# Patient Record
Sex: Male | Born: 1960
Health system: Southern US, Community
[De-identification: ages and names within clinical notes are randomized; demographics above are authoritative.]

## PROBLEM LIST (undated history)

## (undated) DIAGNOSIS — Z95810 Presence of automatic (implantable) cardiac defibrillator: Secondary | ICD-10-CM

## (undated) DIAGNOSIS — E785 Hyperlipidemia, unspecified: Secondary | ICD-10-CM

## (undated) DIAGNOSIS — F32A Depression, unspecified: Secondary | ICD-10-CM

## (undated) DIAGNOSIS — F329 Major depressive disorder, single episode, unspecified: Secondary | ICD-10-CM

## (undated) DIAGNOSIS — E669 Obesity, unspecified: Secondary | ICD-10-CM

## (undated) DIAGNOSIS — F419 Anxiety disorder, unspecified: Secondary | ICD-10-CM

## (undated) DIAGNOSIS — J302 Other seasonal allergic rhinitis: Secondary | ICD-10-CM

## (undated) DIAGNOSIS — I472 Ventricular tachycardia, unspecified: Secondary | ICD-10-CM

## (undated) DIAGNOSIS — C4491 Basal cell carcinoma of skin, unspecified: Secondary | ICD-10-CM

## (undated) DIAGNOSIS — I10 Essential (primary) hypertension: Secondary | ICD-10-CM

## (undated) DIAGNOSIS — M1990 Unspecified osteoarthritis, unspecified site: Secondary | ICD-10-CM

## (undated) DIAGNOSIS — H547 Unspecified visual loss: Secondary | ICD-10-CM

## (undated) DIAGNOSIS — I422 Other hypertrophic cardiomyopathy: Secondary | ICD-10-CM

## (undated) HISTORY — DX: Hyperlipidemia, unspecified: E78.5

## (undated) HISTORY — DX: Essential (primary) hypertension: I10

## (undated) HISTORY — DX: Other seasonal allergic rhinitis: J30.2

## (undated) HISTORY — DX: Presence of automatic (implantable) cardiac defibrillator: Z95.810

## (undated) HISTORY — DX: Ventricular tachycardia: I47.2

## (undated) HISTORY — PX: MOHS SURGERY: SHX181

## (undated) HISTORY — DX: Other hypertrophic cardiomyopathy: I42.2

## (undated) HISTORY — DX: Basal cell carcinoma of skin, unspecified: C44.91

## (undated) HISTORY — DX: Depression, unspecified: F32.A

## (undated) HISTORY — DX: Obesity, unspecified: E66.9

## (undated) HISTORY — DX: Unspecified visual loss: H54.7

## (undated) HISTORY — DX: Anxiety disorder, unspecified: F41.9

## (undated) HISTORY — DX: Major depressive disorder, single episode, unspecified: F32.9

## (undated) HISTORY — DX: Unspecified osteoarthritis, unspecified site: M19.90

## (undated) HISTORY — DX: Ventricular tachycardia, unspecified: I47.20

---

## 2005-03-16 DIAGNOSIS — C4491 Basal cell carcinoma of skin, unspecified: Secondary | ICD-10-CM

## 2005-03-16 HISTORY — DX: Basal cell carcinoma of skin, unspecified: C44.91

## 2010-10-20 ENCOUNTER — Ambulatory Visit (INDEPENDENT_AMBULATORY_CARE_PROVIDER_SITE_OTHER): Payer: 59 | Admitting: Medical

## 2010-10-20 ENCOUNTER — Encounter: Payer: Self-pay | Admitting: Medical

## 2010-10-20 VITALS — BP 100/72 | HR 64 | Temp 98.2°F | Ht 73.0 in | Wt 225.0 lb

## 2010-10-20 DIAGNOSIS — J40 Bronchitis, not specified as acute or chronic: Secondary | ICD-10-CM

## 2010-10-20 MED ORDER — ERYTHROMYCIN BASE 500 MG PO TABS: 500.0000 mg | ORAL_TABLET | Freq: Two times a day (BID) | ORAL | Status: AC

## 2010-10-20 MED ORDER — HYDROCODONE-HOMATROPINE 5-1.5 MG/5ML PO SYRP: 5.0000 mL | ORAL_SOLUTION | Freq: Four times a day (QID) | ORAL | Status: AC | PRN

## 2010-10-20 NOTE — Progress Notes (Signed)
Subjective:     Nathan [name] is a 50 y.o. male who presents for evaluation of productive cough.  He is a nonsmoker and has been in usual state of healthy up until 1 wk ago.  Symptoms include achiness, congestion, lightheadedness, low grade fever and productive cough with  green colored sputum. Onset of symptoms was 1 week ago, and has been gradually worsening since that time. Treatment to date: Nyquil and Dayquil.  Wife been sick with same on medication last week, was seen here per pt.  No other aggravating or relieving factors.  No other c/o.  The following portions of the patient's history were reviewed and updated as appropriate: allergies, current medications, past family history, past medical history, past social history, past surgical history and problem list.  Review of Systems Constitutional: +low grade fever, chills; denies sweats, anorexia Skin: denies rash HEENT:  denies ear pain, ST, itchy watery eyes Cardiovascular: denies chest pain, palpitations Lungs:  +productive sputum; denies wheezing, hemoptysis, orthopnea, PND Abdomen: +few episodes of diarrhea; denies abdominal pain, nausea, vomiting GU: denies dysuria Extremities: denies edema, myalgias, arthralgias  Objective:   Filed Vitals:   10/20/10 1215  BP: 100/72  Pulse: 64  Temp: 98.2 F (36.8 C)    General appearance: Alert, WD/WN, no distress, mildly ill appearing                             Skin: warm, no rash, no diaphoresis                           Head: no sinus tenderness                            Eyes: conjunctiva normal, corneas clear, PERRLA                            Ears: pearly TMs, external ear canals normal                          Nose: septum midline, turbinates swollen, with erythema and clear discharge             Mouth/throat: MMM, tongue normal, mild pharyngeal erythema                           Neck: supple, no adenopathy, no thyromegaly, nontender                          Heart: RRR, normal  S1, S2, no murmurs                         Lungs: +bronchial breath sounds, +few lower field scattered rhonchi, no wheezes, no rales                Extremities: no edema, nontender     Assessment:   Encounter Diagnosis  Name Primary?  . Bronchitis Yes     Plan:   Prescription given today for Erythromycin and Hycodan as below.  Discussed diagnosis and treatment of bronchitis.  Suggested symptomatic OTC remedies for cough and congestion.  Nasal saline spray for nasal congestion.  Tylenol or Ibuprofen OTC for fever and malaise.  Call/return in 2-3 days if  symptoms are worse or not improving.  Advised that cough may linger even after the infection is improved.

## 2012-02-14 DIAGNOSIS — H547 Unspecified visual loss: Secondary | ICD-10-CM

## 2012-02-14 HISTORY — DX: Unspecified visual loss: H54.7

## 2012-02-22 ENCOUNTER — Other Ambulatory Visit: Payer: Self-pay | Admitting: Cardiology

## 2012-02-22 ENCOUNTER — Ambulatory Visit (INDEPENDENT_AMBULATORY_CARE_PROVIDER_SITE_OTHER): Payer: 59 | Admitting: Medical

## 2012-02-22 ENCOUNTER — Encounter: Payer: Self-pay | Admitting: Medical

## 2012-02-22 ENCOUNTER — Ambulatory Visit
Admission: RE | Admit: 2012-02-22 | Discharge: 2012-02-22 | Disposition: A | Discharge status: Home / Self Care | Payer: 59 | Source: Ambulatory Visit | Attending: Cardiology | Admitting: Cardiology

## 2012-02-22 VITALS — BP 110/80 | HR 60 | Temp 98.1°F | Resp 16 | Ht 72.0 in | Wt 236.0 lb

## 2012-02-22 DIAGNOSIS — R0600 Dyspnea, unspecified: Secondary | ICD-10-CM | POA: Insufficient documentation

## 2012-02-22 DIAGNOSIS — Z1211 Encounter for screening for malignant neoplasm of colon: Secondary | ICD-10-CM

## 2012-02-22 DIAGNOSIS — R3129 Other microscopic hematuria: Secondary | ICD-10-CM

## 2012-02-22 DIAGNOSIS — R0602 Shortness of breath: Secondary | ICD-10-CM

## 2012-02-22 DIAGNOSIS — R0609 Other forms of dyspnea: Secondary | ICD-10-CM

## 2012-02-22 DIAGNOSIS — R9431 Abnormal electrocardiogram [ECG] [EKG]: Secondary | ICD-10-CM

## 2012-02-22 DIAGNOSIS — D492 Neoplasm of unspecified behavior of bone, soft tissue, and skin: Secondary | ICD-10-CM

## 2012-02-22 DIAGNOSIS — E669 Obesity, unspecified: Secondary | ICD-10-CM

## 2012-02-22 DIAGNOSIS — Z0000 Encounter for general adult medical examination without abnormal findings: Secondary | ICD-10-CM

## 2012-02-22 DIAGNOSIS — Z23 Encounter for immunization: Secondary | ICD-10-CM

## 2012-02-22 DIAGNOSIS — R079 Chest pain, unspecified: Secondary | ICD-10-CM | POA: Insufficient documentation

## 2012-02-22 LAB — CBC WITH DIFFERENTIAL/PLATELET
Basophils Absolute: 0.1 10*3/uL (ref 0.0–0.1)
HCT: 45 % (ref 39.0–52.0)
Lymphocytes Relative: 24 % (ref 12–46)
Lymphs Abs: 1.6 10*3/uL (ref 0.7–4.0)
Monocytes Absolute: 0.5 10*3/uL (ref 0.1–1.0)
Neutro Abs: 4.3 10*3/uL (ref 1.7–7.7)
Platelets: 198 10*3/uL (ref 150–400)
RBC: 5.12 MIL/uL (ref 4.22–5.81)
RDW: 13.7 % (ref 11.5–15.5)
WBC: 6.8 10*3/uL (ref 4.0–10.5)

## 2012-02-22 LAB — LIPID PANEL
HDL: 32 mg/dL — ABNORMAL LOW (ref >39)
Triglycerides: 187 mg/dL — ABNORMAL HIGH (ref <150)

## 2012-02-22 LAB — COMPREHENSIVE METABOLIC PANEL
ALT: 30 U/L (ref 0–53)
BUN: 16 mg/dL (ref 6–23)
CO2: 27 mEq/L (ref 19–32)
Calcium: 9.2 mg/dL (ref 8.4–10.5)
Chloride: 107 mEq/L (ref 96–112)
Creat: 0.99 mg/dL (ref 0.50–1.35)
Glucose, Bld: 102 mg/dL — ABNORMAL HIGH (ref 70–99)

## 2012-02-22 LAB — POCT URINALYSIS DIPSTICK
Bilirubin, UA: NEGATIVE
Ketones, UA: NEGATIVE
Leukocytes, UA: NEGATIVE
Protein, UA: NEGATIVE
Spec Grav, UA: 1.01
pH, UA: 5
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
## 2012-02-22 NOTE — H&P (Signed)
  Balfour, Samay    Date of visit:  02/22/2012 DOB:  02/23/1961    Age:  51 yrs. Medical record number:  [id number]     Account number:  [id number] Primary Care Provider: TYSINGER, DAVID SHANE ____________________________ CURRENT DIAGNOSES  1. Chest Pain  2. Abnormal EKG  3. Dyspnea  4. Obesity(BMI30-40) ____________________________ ALLERGIES  NKDA ____________________________ MEDICATIONS  1. aspirin 81 mg tablet,chewable, 1 p.o. daily  2. Crestor 10 mg tablet, 1 p.o. daily  3. nitroglycerin 0.4 mg tablet, sublingual, PRN ____________________________ CHIEF COMPLAINTS  Dyspnea with exertion  Heaviness in chest ____________________________ HISTORY OF PRESENT ILLNESS  This very nice man is seen for cardiac consultation. He has previously been in good health without significant medical issues but has not had an evaluation in some time. For a number of months he has noted some progressive dyspnea with exertion as well as some vague upper left chest pains. He states the discomfort comes and goes and he is vague in describing it and states that he will occasionally have left arm discomfort with that. Over the past couple of weeks his dyspnea became worse and he had a particularly severe episode of dyspnea around 8 days ago while walking up a hill hunting with his son. He described dyspnea severe and acute and this prompted him to get a physical examination. He since that time has continued to have vague intermittent chest discomfort but states that his not any different than what he has been having for months. He was seen for his physical he was found to have significant anterolateral T wave inversions. He has never had a previous EKG. He denies PND or orthopnea. He does not have any significant claudication. He does not have significant erectile dysfunction. He does not have any significant edema. ____________________________ PAST HISTORY  Past Medical Illnesses:  denies hypertension or diabetes;   Cardiovascular Illnesses:  no previous history of cardiac disease.;  Surgical Procedures:  no prior surgical procedures;  Cardiology Procedures-Invasive:  no history of prior cardiac procedures;  Cardiology Procedures-Noninvasive:  no previous non-invasive procedures;  LVEF not documented ____________________________ CARDIO-PULMONARY TEST DATES EKG Date:  02/22/2012;   ____________________________ FAMILY HISTORY Father - age 85,  diabetes-unknown type,  history of CABG and  history of PTCA/stent; Mother - age 82,  alive and well; Brother 1 - age 48,  alive and well,  hypertension and hyperlipidemia;  ____________________________ SOCIAL HISTORY Alcohol Use:  occasionally;  Smoking:  never smoked;  Diet:  regular diet;  Lifestyle:  married;  Exercise:  no regular exercise;  Occupation:  real estate appraiser;  Residence:  lives with wife and son;   ____________________________ REVIEW OF SYSTEMS General:  weight gain of approximately 10 lbs Integumentary:  history of basal cell carcinoma Eyes:  denies diplopia, history of glaucoma or visual problems.Ears, Nose, Throat, Mouth:  denies any hearing loss, epistaxis, hoarseness or difficulty speaking.  Respiratory:  denies dyspnea, cough, wheezing or hemoptysis.Cardiovascular:  please review HPI  Abdominal:  denies dyspepsia, GI bleeding, constipation, or diarrhea  Genitourinary-Male:  no dysuria, urgency, frequency, or nocturia  Musculoskeletal:  arthritis of the knees  Neurological:  denies headaches, stroke, or TIA  Psychiatric:  insomnia, situational stress  Hematological/Immunologic:  denies any food allergies, bleeding disorders. ____________________________ PHYSICAL EXAMINATION VITAL SIGNS  Blood Pressure:  140/60 Sitting, Right arm, regular cuff  , 132/68 Standing, Right arm and regular cuff   Pulse:  80/min. Weight:  236.00 lbs. Height:  73"BMI: 31  Constitutional:  pleasant white   male in no acute distress, mildly obese Skin:  warm and dry to  touch, no apparent skin lesions, or masses noted. Head:  normocephalic, normal hair pattern, no masses or tenderness Eyes:  EOMS Intact, PERRLA, C and S clear, Funduscopic exam not done. ENT:  ears, nose and throat reveal no gross abnormalities.  Dentition good. Neck:  supple, without massess. No JVD, thyromegaly or carotid bruits. Carotid upstroke normal. Chest:  normal symmetry, clear to auscultation and percussion. Cardiac:  regular rhythm, normal S1 and S2, No S3 or S4, no murmurs, gallops or rubs detected. Abdomen:  abdomen soft,non-tender, no masses, no hepatospenomegaly, or aneurysm noted Peripheral Pulses:  the femoral,dorsalis pedis, and posterior tibial pulses are full and equal bilaterally with no bruits auscultated. Extremities & Back:  no deformities, clubbing, cyanosis, erythema or edema observed. Normal muscle strength and tone. Neurological:  no gross motor or sensory deficits noted, affect appropriate, oriented x3. ____________________________ IMPRESSIONS/PLAN  1. Progressive dyspnea on exertion and atypical chest pain with a markedly abnormal EKG consistent with significant ischemia. 2. Markedly abnormal EKG 3. Atypical chest pain 4. Obesity  Recommendations:  He has a markedly abnormal EKG consistent with high risk ischemia. I don't think that an exercise test will be helpful in evaluation of his symptoms particularly in view of his poor exercise tolerance now. He does have a family history of cardiac disease and had laboratory work done earlier today at his primary physician's office.  I think that he needs to have evaluation for coronary artery disease and suggested proceeding directly to cardiac catheterization. He was started on aspirin 81 mg and given a prescription for nitroglycerin. He was also started on Crestor 10 mg daily.  The cardiac catheterization procedure was explained to the patient including risks of myocardial infarction, death, stroke, bleeding,  arrhythmia, dye allergy, renal insufficiency. He understands and is willing to proceed. Possibility of intervention was also discussed with the patient. ____________________________ TODAYS ORDERS  1. 2D, color flow, doppler: First Available  2. Draw PT/INR: Today  3. PTT: Today  4. CHEST XRAY: Today  5. 12 Lead EKG: Today                       ____________________________ Cardiology Physician:  W. Spencer Eilidh Marcano, Jr. MD [facility]    

## 2012-02-25 ENCOUNTER — Encounter (HOSPITAL_BASED_OUTPATIENT_CLINIC_OR_DEPARTMENT_OTHER)
Admission: RE | Disposition: A | Discharge status: Home / Self Care | Payer: Self-pay | Source: Ambulatory Visit | Attending: Cardiology

## 2012-02-25 ENCOUNTER — Inpatient Hospital Stay (HOSPITAL_BASED_OUTPATIENT_CLINIC_OR_DEPARTMENT_OTHER)
Admission: RE | Admit: 2012-02-25 | Discharge: 2012-02-25 | Disposition: A | Discharge status: Home / Self Care | Payer: 59 | Source: Ambulatory Visit | Attending: Cardiology | Admitting: Cardiology

## 2012-02-25 ENCOUNTER — Encounter (HOSPITAL_BASED_OUTPATIENT_CLINIC_OR_DEPARTMENT_OTHER): Payer: Self-pay | Admitting: Cardiology

## 2012-02-25 ENCOUNTER — Other Ambulatory Visit: Payer: Self-pay | Admitting: Cardiovascular Disease

## 2012-02-25 DIAGNOSIS — R9431 Abnormal electrocardiogram [ECG] [EKG]: Secondary | ICD-10-CM | POA: Insufficient documentation

## 2012-02-25 DIAGNOSIS — Z6830 Body mass index (BMI) 30.0-30.9, adult: Secondary | ICD-10-CM | POA: Insufficient documentation

## 2012-02-25 DIAGNOSIS — R0989 Other specified symptoms and signs involving the circulatory and respiratory systems: Secondary | ICD-10-CM | POA: Insufficient documentation

## 2012-02-25 DIAGNOSIS — E669 Obesity, unspecified: Secondary | ICD-10-CM | POA: Insufficient documentation

## 2012-02-25 DIAGNOSIS — R0609 Other forms of dyspnea: Secondary | ICD-10-CM | POA: Insufficient documentation

## 2012-02-25 DIAGNOSIS — R079 Chest pain, unspecified: Secondary | ICD-10-CM | POA: Insufficient documentation

## 2012-02-25 DIAGNOSIS — I422 Other hypertrophic cardiomyopathy: Secondary | ICD-10-CM | POA: Insufficient documentation

## 2012-02-25 SURGERY — JV LEFT HEART CATHETERIZATION WITH CORONARY ANGIOGRAM
Anesthesia: Moderate Sedation

## 2012-02-25 MED ORDER — ACETAMINOPHEN 325 MG PO TABS: 650.0000 mg | ORAL_TABLET | ORAL | Status: DC | PRN

## 2012-02-25 MED ORDER — SODIUM CHLORIDE 0.9 % IV SOLN: INTRAVENOUS | Status: DC

## 2012-02-25 MED ORDER — DIAZEPAM 5 MG PO TABS
10.0000 mg | ORAL_TABLET | ORAL | Status: AC
  Administered 2012-02-25: 10 mg via ORAL

## 2012-02-25 MED ORDER — SODIUM CHLORIDE 0.9 % IV SOLN: 1.0000 mL/kg/h | INTRAVENOUS | Status: DC

## 2012-02-25 NOTE — OR Nursing (Signed)
Discharge instructions reviewed and signed, pt stated understanding, ambulated in hall without difficulty, site level 0, transported to wife's car via wheelchair 

## 2012-02-25 NOTE — H&P (View-Only) (Signed)
Nathan [name]    Date of visit:  02/22/2012 DOB:  1960/09/12    Age:  51 yrs. Medical record number:  [id number]     Account number:  [id number] Primary Care Provider: Ernst Breach ____________________________ CURRENT DIAGNOSES  1. Chest Pain  2. Abnormal EKG  3. Dyspnea  4. Obesity(BMI30-40) ____________________________ ALLERGIES  NKDA ____________________________ MEDICATIONS  1. aspirin 81 mg tablet,chewable, 1 p.o. daily  2. Crestor 10 mg tablet, 1 p.o. daily  3. nitroglycerin 0.4 mg tablet, sublingual, PRN ____________________________ CHIEF COMPLAINTS  Dyspnea with exertion  Heaviness in chest ____________________________ HISTORY OF PRESENT ILLNESS  This very nice man is seen for cardiac consultation. He has previously been in good health without significant medical issues but has not had an evaluation in some time. For a number of months he has noted some progressive dyspnea with exertion as well as some vague upper left chest pains. He states the discomfort comes and goes and he is vague in describing it and states that he will occasionally have left arm discomfort with that. Over the past couple of weeks his dyspnea became worse and he had a particularly severe episode of dyspnea around 8 days ago while walking up a hill hunting with his son. He described dyspnea severe and acute and this prompted him to get a physical examination. He since that time has continued to have vague intermittent chest discomfort but states that his not any different than what he has been having for months. He was seen for his physical he was found to have significant anterolateral T wave inversions. He has never had a previous EKG. He denies PND or orthopnea. He does not have any significant claudication. He does not have significant erectile dysfunction. He does not have any significant edema. ____________________________ PAST HISTORY  Past Medical Illnesses:  denies hypertension or diabetes;   Cardiovascular Illnesses:  no previous history of cardiac disease.;  Surgical Procedures:  no prior surgical procedures;  Cardiology Procedures-Invasive:  no history of prior cardiac procedures;  Cardiology Procedures-Noninvasive:  no previous non-invasive procedures;  LVEF not documented ____________________________ CARDIO-PULMONARY TEST DATES EKG Date:  02/22/2012;   ____________________________ FAMILY HISTORY Father - age 85,  diabetes-unknown type,  history of CABG and  history of PTCA/stent; Mother - age 3,  alive and well; Brother 1 - age 14,  alive and well,  hypertension and hyperlipidemia;  ____________________________ SOCIAL HISTORY Alcohol Use:  occasionally;  Smoking:  never smoked;  Diet:  regular diet;  Lifestyle:  married;  Exercise:  no regular exercise;  Occupation:  Technical sales engineer;  Residence:  lives with wife and son;   ____________________________ REVIEW OF SYSTEMS General:  weight gain of approximately 10 lbs Integumentary:  history of basal cell carcinoma Eyes:  denies diplopia, history of glaucoma or visual problems.Ears, Nose, Throat, Mouth:  denies any hearing loss, epistaxis, hoarseness or difficulty speaking.  Respiratory:  denies dyspnea, cough, wheezing or hemoptysis.Cardiovascular:  please review HPI  Abdominal:  denies dyspepsia, GI bleeding, constipation, or diarrhea  Genitourinary-Male:  no dysuria, urgency, frequency, or nocturia  Musculoskeletal:  arthritis of the knees  Neurological:  denies headaches, stroke, or TIA  Psychiatric:  insomnia, situational stress  Hematological/Immunologic:  denies any food allergies, bleeding disorders. ____________________________ PHYSICAL EXAMINATION VITAL SIGNS  Blood Pressure:  140/60 Sitting, Right arm, regular cuff  , 132/68 Standing, Right arm and regular cuff   Pulse:  80/min. Weight:  236.00 lbs. Height:  73"BMI: 31  Constitutional:  pleasant white  male in no acute distress, mildly obese Skin:  warm and dry to  touch, no apparent skin lesions, or masses noted. Head:  normocephalic, normal hair pattern, no masses or tenderness Eyes:  EOMS Intact, PERRLA, C and S clear, Funduscopic exam not done. ENT:  ears, nose and throat reveal no gross abnormalities.  Dentition good. Neck:  supple, without massess. No JVD, thyromegaly or carotid bruits. Carotid upstroke normal. Chest:  normal symmetry, clear to auscultation and percussion. Cardiac:  regular rhythm, normal S1 and S2, No S3 or S4, no murmurs, gallops or rubs detected. Abdomen:  abdomen soft,non-tender, no masses, no hepatospenomegaly, or aneurysm noted Peripheral Pulses:  the femoral,dorsalis pedis, and posterior tibial pulses are full and equal bilaterally with no bruits auscultated. Extremities & Back:  no deformities, clubbing, cyanosis, erythema or edema observed. Normal muscle strength and tone. Neurological:  no gross motor or sensory deficits noted, affect appropriate, oriented x3. ____________________________ IMPRESSIONS/PLAN  1. Progressive dyspnea on exertion and atypical chest pain with a markedly abnormal EKG consistent with significant ischemia. 2. Markedly abnormal EKG 3. Atypical chest pain 4. Obesity  Recommendations:  He has a markedly abnormal EKG consistent with high risk ischemia. I don't think that an exercise test will be helpful in evaluation of his symptoms particularly in view of his poor exercise tolerance now. He does have a family history of cardiac disease and had laboratory work done earlier today at his primary physician's office.  I think that he needs to have evaluation for coronary artery disease and suggested proceeding directly to cardiac catheterization. He was started on aspirin 81 mg and given a prescription for nitroglycerin. He was also started on Crestor 10 mg daily.  The cardiac catheterization procedure was explained to the patient including risks of myocardial infarction, death, stroke, bleeding,  arrhythmia, dye allergy, renal insufficiency. He understands and is willing to proceed. Possibility of intervention was also discussed with the patient. ____________________________ TODAYS ORDERS  1. 2D, color flow, doppler: First Available  2. Draw PT/INR: Today  3. PTT: Today  4. CHEST XRAY: Today  5. 12 Lead EKG: Today                       ____________________________ Cardiology Physician:  Darden Palmer MD [facility]

## 2012-02-25 NOTE — Interval H&P Note (Signed)
History and Physical Interval Note:  02/25/2012 8:34 AM    Patient seen and examined.  No interval change in history and exam since last note. Stable for procedure.  Nathan [name]. MD [facility]  02/25/2012

## 2012-02-25 NOTE — Progress Notes (Signed)
Bedrest begins @ 0920.  Tegaderm dressing applied to right groin site by Venda Rodes, site level 0.

## 2012-02-25 NOTE — CV Procedure (Signed)
Cardiac Catheterization Report   Nathan [name]    51 y.o.  male  DOB: 11/25/1960  MRN: [id number]  02/25/2012   PROCEDURE:  Left heart catheterization with selective coronary angiography, left ventriculogram.  INDICATIONS:  Progressive dyspnea on exertion, severely abnormal EKG, significant cardiac hypertrophy.The risks, benefits, and details of the procedure were explained to the patient.  The patient verbalized understanding and wanted to proceed.  Informed written consent was obtained.  PROCEDURE TECHNIQUE:  After Xylocaine anesthesia a 4 F sheath was placed in the right femoral artery with a single anterior needle wall stick.   Left coronary angiography was done using a Judkins L4 guide catheter.  Right coronary angiography was done using a Judkins R4 guide catheter.  A 30 cc ventriculogram was performed in the 30 degree RAO projection.  Tolerated the procedure well.  Sheath removed in the holding area.   CONTRAST:  Total of 80 cc.  COMPLICATIONS:  None.    HEMODYNAMICS:  Aortic postcontrast 100/60, left ventricle postcontrast 100/10-20  There was no gradient between the left ventricle and aorta.    ANGIOGRAPHIC DATA:    CORONARY ARTERIES:   Arise and distribute normally.  Left dominant. No coronary calcification is noted.  Left main coronary artery: Normal.  Left anterior descending: Normal..  Circumflex coronary artery: Normal. Very large vessel which is left dominant.  Right coronary artery: Nondominant vessel, normal..  LEFT VENTRICULOGRAM:  Performed in the 30 RAO projection.  The aortic and mitral valves are normal. The left ventricle is has a significant spade-shaped appearance with hypertrophy and hyperdynamic constriction of the apex. There is also increased trabeculations consistent with hypertrophy.. Estimated ejection fraction is 65%.  IMPRESSIONS:  1. Normal coronary arteries left dominant circulation 2. Angiographic  spade-shaped left ventricle consistent with apical hypertrophic cardiomyopathy  RECOMMENDATION:  The patient has no angiographic coronary artery disease but does have evidence of significant ventricular hypertrophy and an asymmetric pattern. He will have a cardiac MRI to determine further characteristics of the apical hypertrophy and also to assess for myocardial scarring.  Darden Palmer MD [facility]

## 2012-02-25 NOTE — OR Nursing (Signed)
Dr Tilley at bedside to discuss results and treatment plan with pt and family 

## 2012-02-25 NOTE — OR Nursing (Signed)
Meal served 

## 2012-03-02 ENCOUNTER — Encounter: Payer: Self-pay | Admitting: Medical

## 2012-03-02 ENCOUNTER — Other Ambulatory Visit (HOSPITAL_COMMUNITY): Payer: Self-pay | Admitting: Cardiology

## 2012-03-02 DIAGNOSIS — R079 Chest pain, unspecified: Secondary | ICD-10-CM

## 2012-03-02 DIAGNOSIS — I422 Other hypertrophic cardiomyopathy: Secondary | ICD-10-CM

## 2012-03-03 ENCOUNTER — Ambulatory Visit (HOSPITAL_COMMUNITY)
Admission: RE | Admit: 2012-03-03 | Discharge: 2012-03-03 | Disposition: A | Discharge status: Home / Self Care | Payer: 59 | Source: Ambulatory Visit | Attending: Cardiology | Admitting: Cardiology

## 2012-03-03 DIAGNOSIS — I422 Other hypertrophic cardiomyopathy: Secondary | ICD-10-CM

## 2012-03-03 DIAGNOSIS — R079 Chest pain, unspecified: Secondary | ICD-10-CM

## 2012-03-03 DIAGNOSIS — I421 Obstructive hypertrophic cardiomyopathy: Secondary | ICD-10-CM

## 2012-03-03 MED ORDER — GADOBENATE DIMEGLUMINE 529 MG/ML IV SOLN
30.0000 mL | Freq: Once | INTRAVENOUS | Status: AC | PRN
  Administered 2012-03-03: 30 mL via INTRAVENOUS

## 2012-03-04 ENCOUNTER — Encounter (INDEPENDENT_AMBULATORY_CARE_PROVIDER_SITE_OTHER): Payer: 59

## 2012-03-04 ENCOUNTER — Other Ambulatory Visit: Payer: Self-pay

## 2012-03-04 ENCOUNTER — Encounter: Payer: Self-pay | Admitting: Cardiology

## 2012-03-04 ENCOUNTER — Other Ambulatory Visit: Payer: Self-pay | Admitting: *Deleted

## 2012-03-04 DIAGNOSIS — R079 Chest pain, unspecified: Secondary | ICD-10-CM

## 2012-03-04 DIAGNOSIS — R0602 Shortness of breath: Secondary | ICD-10-CM

## 2012-03-04 DIAGNOSIS — R9431 Abnormal electrocardiogram [ECG] [EKG]: Secondary | ICD-10-CM

## 2012-03-04 NOTE — Progress Notes (Signed)
Patient ID: Nathan [name], male   DOB: 1961/02/15, 51 y.o.   MRN: [id number]  Karsin, Pesta    Date of visit:  03/04/2012 DOB:  October 25, 1960    Age:  51 yrs. Medical record number:  [id number]     Account number:  [id number] Primary Care Provider: Ernst Breach ____________________________ CURRENT DIAGNOSES  1. Apical Hypertrophic Cardiomyopathy  2. Abnormal EKG  3. Obesity(BMI30-40) ____________________________ ALLERGIES  NKDA ____________________________ MEDICATIONS  1. Crestor 10 mg tablet, 1 p.o. daily  2. metoprolol succinate 50 mg tablet extended release 24 hr, 1 p.o. daily ____________________________ CHIEF COMPLAINTS  Followup of Apical Hypertrophic Cardiomyopathy ____________________________ HISTORY OF PRESENT ILLNESS  Patient returns for cardiac followup. Since he was previously here he had a cardiac catheterization that showed normal coronary arteries. The echocardiogram showed what looked like mid ventricular and apical hypertrophy. He has subsequently had a cardiac MRI that showed extensive mid ventricular and apical hypertrophy with extensive scarring at the apex on cardiac MRI. His ejection fraction was 60%.  He returns today for followup and further discussion. He had an episode of dizziness about 2 years ago but has never had syncope. There's no family history of syncope. He does have some dyspnea with exertion. ____________________________ PAST HISTORY  Past Medical Illnesses:  denies hypertension or diabetes;  Cardiovascular Illnesses:  no previous history of cardiac disease.;  Surgical Procedures:  no prior surgical procedures;  Cardiology Procedures-Invasive:  cardiac cath (left) December 2013;  Cardiology Procedures-Noninvasive:  echocardiogram December 2013, cardiac MRI December 2013;  Cardiac Cath Results:  normal coronary arteries;  LVEF of 65% documented via cardiac cath on 02/25/2012 ____________________________ CARDIO-PULMONARY TEST DATES EKG Date:   02/22/2012;   Cardiac Cath Date:  02/25/2012;  Echocardiography Date: 02/24/2012;  Chest Xray Date: 02/22/2012;  CT Scan Date:  03/03/2012   ____________________________ SOCIAL HISTORY Alcohol Use:  occasionally;  Smoking:  never smoked;  Diet:  regular diet;  Lifestyle:  married;  Exercise:  no regular exercise;  Occupation:  Technical sales engineer;  Residence:  lives with wife and son;   ____________________________ PHYSICAL EXAMINATION VITAL SIGNS  Blood Pressure:  124/70 Sitting, Left arm, regular cuff  , 118/62 Supine, Left arm and regular cuff   Pulse:  72/min. Weight:  234.00 lbs. Height:  73"BMI: 31  Constitutional:  pleasant white male in no acute distress, mildly obese Skin:  warm and dry to touch, no apparent skin lesions, or masses noted. Head:  normocephalic, normal hair pattern, no masses or tenderness Chest:  normal symmetry, clear to auscultation and percussion. Cardiac:  regular rhythm, normal S1 and S2, No S3 or S4, no murmurs, gallops or rubs detected. Extremities & Back:  cath site clean and dry ____________________________ IMPRESSIONS/PLAN  1. Apical variant hypertrophic cardiomyopathy with stents of myocardial scarring at the apex as well as the mid ventricular segment 2. Dyspnea that may be due to the above 3. Hyperlipidemia 4. Obesity  Recommendations:  Extensive discussion with patient. He does have extensive myocardial scarring and we discussed the apical hypertrophy, his abnormal EKG and symptoms. He has never had syncope or palpitations.  I've recommended that he initiate treatment with a beta blocker with extended release metoprolol 50 mg daily. I would like for him to have a 48-hour Holter monitor to determine what his PVC count is and whether he has any more advanced arrhythmias. I would also like him to have an electrophysiology consultation to determine whether he could be at risk for sudden death because of the amount  of  myocardial scarring and whether he  would need to be considered for a defibrillator.  We talked about activity and avoidance of weight lifting but did give the okay to hunt  and to fish. Followup in one month. We also talked about following a carbohydrate restricted diet and try to get some regular exercise such as walking. ____________________________ TODAYS ORDERS  1. Return Visit: 1 month  2. 48 hour holter monitor  3.  Consult  [facility] EP: Schedule at convenience                       ____________________________ Cardiology Physician:  Darden Palmer MD [facility]

## 2012-03-04 NOTE — Progress Notes (Signed)
Patient ID: Nathan [name], male   DOB: [birth year], 51 y.o.   MRN: [id number] Placed a 48 hr holter monitor on patient on 03/04/12 serial # [id number]. I also went over instruction on how to use the monitor and when to return the monitor

## 2012-03-08 ENCOUNTER — Institutional Professional Consult (permissible substitution): Payer: Self-pay | Admitting: Internal Medicine

## 2012-03-08 ENCOUNTER — Ambulatory Visit (INDEPENDENT_AMBULATORY_CARE_PROVIDER_SITE_OTHER): Payer: 59 | Admitting: Internal Medicine

## 2012-03-08 ENCOUNTER — Encounter: Payer: Self-pay | Admitting: Internal Medicine

## 2012-03-08 ENCOUNTER — Encounter: Payer: Self-pay | Admitting: *Deleted

## 2012-03-08 VITALS — BP 127/74 | HR 70 | Resp 18 | Ht 72.0 in | Wt 235.0 lb

## 2012-03-08 DIAGNOSIS — I422 Other hypertrophic cardiomyopathy: Secondary | ICD-10-CM

## 2012-03-08 DIAGNOSIS — I428 Other cardiomyopathies: Secondary | ICD-10-CM

## 2012-03-08 DIAGNOSIS — I2589 Other forms of chronic ischemic heart disease: Secondary | ICD-10-CM

## 2012-03-08 DIAGNOSIS — I255 Ischemic cardiomyopathy: Secondary | ICD-10-CM
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
## 2012-03-08 NOTE — Assessment & Plan Note (Signed)
The patient has a new diagnosis of apical HCM. He has been found by MR to have significant scarring. This is thought to be a modulating risk factor is a posterior risk factor in of itself. We await the results of his Holter monitor and he will need exercise testing to look to see if he has abnormal vasomotor response. His one episode of syncope occurred in the dentist chair and is likely vasovagal and does not impact our thinking here. In the event that he were to have an abnormal Holter and or exercise test at that point consideration for ICD implantation would be appropriate for primary prevention  We also discussed the importance of trying to identify the kindred with which this changes associated in his family, i.e. either his mother's family or his father's family. We also discussed the importance of screening of his children and that this is a longitudinal process. I recommended that name Rosiland Oz from [facility] who could help with this.

## 2012-03-14 ENCOUNTER — Other Ambulatory Visit (HOSPITAL_COMMUNITY): Payer: 59

## 2012-03-14 ENCOUNTER — Telehealth: Payer: Self-pay | Admitting: Internal Medicine

## 2012-03-14 NOTE — Telephone Encounter (Signed)
Patient called no answer.Left message on personal voice mail monitor results not available.Message sent to Dr.Klein's nurse.

## 2012-03-14 NOTE — Telephone Encounter (Signed)
Pt wanted to get results of monitor he turned in on last Monday

## 2012-04-05 ENCOUNTER — Encounter: Payer: Self-pay | Admitting: Cardiology

## 2012-04-05 NOTE — Progress Notes (Signed)
Patient ID: Nathan [name], male   DOB: [birth year], 52 y.o.   MRN: [id number]  Nathan [name], Nathan [name]    Date of visit:  04/05/2012 DOB:  [birth year]    Age:  51 yrs. Medical record number:  [id number]     Account number:  [id number] Primary Care Provider: Ernst Breach ____________________________ CURRENT DIAGNOSES  1. Apical Hypertrophic Cardiomyopathy  2. Abnormal EKG  3. Obesity(BMI30-40)  4. Dyspnea ____________________________ ALLERGIES  NKDA ____________________________ MEDICATIONS  1. Crestor 10 mg tablet, 1 p.o. daily  2. metoprolol succinate 50 mg tablet extended release 24 hr, 1 p.o. daily ____________________________ CHIEF COMPLAINTS  Followup of Apical Hypertrophic Cardiomyopathy ____________________________ HISTORY OF PRESENT ILLNESS  Patient seen for cardiac followup. He is feeling fairly well at this time and notes less dyspnea. He denies angina and has been able to do some more walking without definite chest pain. He will change the complaint of a vague sensation in his left upper chest and his not related to activity. He has not seen Dr. Graciela Husbands back. I have not heard the results of the 48 hour monitor. ___________________________ PAST HISTORY  Past Medical Illnesses:  denies hypertension or diabetes;  Cardiovascular Illnesses:  no previous history of cardiac disease.;  Surgical Procedures:  no prior surgical procedures;  Cardiology Procedures-Invasive:  cardiac cath (left) December 2013;  Cardiology Procedures-Noninvasive:  echocardiogram December 2013, cardiac MRI December 2013;  Cardiac Cath Results:  normal coronary arteries;  LVEF of 65% documented via cardiac cath on 02/25/2012 ____________________________ CARDIO-PULMONARY TEST DATES EKG Date:  02/22/2012;   Cardiac Cath Date:  02/25/2012;  Echocardiography Date: 02/24/2012;  Chest Xray Date: 02/22/2012;  CT Scan Date:  03/03/2012   ____________________________ SOCIAL HISTORY Alcohol Use:  occasionally;  Smoking:  never  smoked;  Diet:  regular diet;  Lifestyle:  married;  Exercise:  no regular exercise;  Occupation:  Technical sales engineer;  Residence:  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  Cardiovascular:  please review HPI Genitourinary-Male:  no dysuria, urgency, frequency, or nocturia  Musculoskeletal:  arthritis of the knees  Psychiatric:  insomnia, situational stress ____________________________ PHYSICAL EXAMINATION VITAL SIGNS  Blood Pressure:  120/70 Sitting, Left arm, regular cuff  , 114/70 Standing, Left arm and regular cuff   Pulse:  64/min. Weight:  234.00 lbs. Height:  73"BMI: 31  Constitutional:  pleasant white male in no acute distress, mildly obese Skin:  warm and dry to touch, no apparent skin lesions, or masses noted. Head:  normocephalic, normal hair pattern, no masses or tenderness Chest:  normal symmetry, clear to auscultation and percussion. Cardiac:  regular rhythm, normal S1 and S2, No S3 or S4, no murmurs, gallops or rubs detected. Extremities & Back:  no deformities, clubbing, cyanosis, erythema or edema observed. Normal muscle strength and tone. ____________________________ IMPRESSIONS/PLAN  1. Apical hypertrophic variant cardiomyopathy 2. Obesity with need to lose weight 3. Hyperlipidemia  Recommendations:  He has not gotten screening yet for his children. He is doing well on the beta blocker and had a normal blood pressure response on his treadmill. I will see him in followup in 6 months. I asked him to get an appointment with Dr. Graciela Husbands for final recommendations for risk stratification for defibrillator. ____________________________ TODAYS ORDERS  1. Return Visit: 6 months  ____________________________ Cardiology Physician:  Darden Palmer MD [facility]

## 2012-04-18 ENCOUNTER — Encounter: Payer: Self-pay | Admitting: Internal Medicine

## 2012-04-19 ENCOUNTER — Encounter: Payer: Self-pay | Admitting: Internal Medicine

## 2012-04-19 ENCOUNTER — Ambulatory Visit (INDEPENDENT_AMBULATORY_CARE_PROVIDER_SITE_OTHER): Payer: 59 | Admitting: Internal Medicine

## 2012-04-19 VITALS — BP 122/63 | HR 53 | Ht 73.0 in | Wt 236.0 lb

## 2012-04-19 DIAGNOSIS — I428 Other cardiomyopathies: Secondary | ICD-10-CM

## 2012-04-19 DIAGNOSIS — I422 Other hypertrophic cardiomyopathy: Secondary | ICD-10-CM
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
## 2012-04-19 NOTE — Patient Instructions (Signed)
We will see you back on an as needed basis.  

## 2012-04-19 NOTE — Assessment & Plan Note (Signed)
The patient has no clear risk factors for sudden deaths in the setting of his apical HCM. The issue of gadolinium enhancement is thought to be an intermediate variable.  We had a lengthy discussion regarding genetic testing and I recommended that we pursue it to see if we can identify a genetic probe with which to explore the family. I have reviewed with him some of the issues related to genetic testing. Genetic counseling is offered as part of the services from [facility].  I will also explore medication options to try to decrease the mid cavitary obliteration which may have some pathophysiological relationship to the apical thinning

## 2012-07-28 ENCOUNTER — Ambulatory Visit (INDEPENDENT_AMBULATORY_CARE_PROVIDER_SITE_OTHER): Payer: 59 | Admitting: Family Medicine

## 2012-07-28 ENCOUNTER — Encounter: Payer: Self-pay | Admitting: Family Medicine

## 2012-07-28 VITALS — BP 102/64 | HR 56 | Temp 98.2°F | Ht 72.0 in | Wt 232.0 lb

## 2012-07-28 DIAGNOSIS — R05 Cough: Secondary | ICD-10-CM

## 2012-07-28 DIAGNOSIS — R059 Cough, unspecified: Secondary | ICD-10-CM

## 2012-07-28 DIAGNOSIS — J309 Allergic rhinitis, unspecified: Secondary | ICD-10-CM

## 2012-07-28 DIAGNOSIS — R319 Hematuria, unspecified: Secondary | ICD-10-CM

## 2012-07-28 LAB — POCT URINALYSIS DIPSTICK
Bilirubin, UA: NEGATIVE
Ketones, UA: NEGATIVE
Protein, UA: NEGATIVE
Spec Grav, UA: 1.01

## 2012-07-28 LAB — POCT UA - MICROSCOPIC ONLY

## 2012-07-28 MED ORDER — AZITHROMYCIN 250 MG PO TABS: ORAL_TABLET | ORAL | Status: DC
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
## 2012-07-28 NOTE — Patient Instructions (Addendum)
  Take zyrtec daily. Start Mucinex (plain with delsym as needed for cough, or the Mucinex DM which contains the same ingredient as Delsym)  Start z-pak if cough persists, worsens, discolored mucus, fever develops  We will be in touch with results of urine, and what the next step is in evaluating the blood in the urine.  Hematuria, Adult Hematuria (blood in your urine) can be caused by a bladder infection (cystitis), kidney infection (pyelonephritis), prostate infection (prostatitis), or kidney stone. Infections will usually respond to antibiotics (medications which kill germs), and a kidney stone will usually pass through your urine without further treatment. If you were put on antibiotics, take all the medicine until gone. You may feel better in a few days, but take all of your medicine or the infection may not respond and become more difficult to treat. If antibiotics were not given, an infection did not cause the blood in the urine. A further work up to find out the reason may be needed. HOME CARE INSTRUCTIONS   Drink lots of fluid, 3 to 4 quarts a day. If you have been diagnosed with an infection, cranberry juice is especially recommended, in addition to large amounts of water.  Avoid caffeine, tea, and carbonated beverages, because they tend to irritate the bladder.  Avoid alcohol as it may irritate the prostate.  Only take over-the-counter or prescription medicines for pain, discomfort, or fever as directed by your caregiver.  If you have been diagnosed with a kidney stone follow your caregivers instructions regarding straining your urine to catch the stone. TO PREVENT FURTHER INFECTIONS:  Empty the bladder often. Avoid holding urine for long periods of time.  After a bowel movement, women should cleanse front to back. Use each tissue only once.  Empty the bladder before and after sexual intercourse if you are a male.  Return to your caregiver if you develop back pain, fever,  nausea (feeling sick to your stomach), vomiting, or your symptoms (problems) are not better in 3 days. Return sooner if you are getting worse. If you have been requested to return for further testing make sure to keep your appointments. If an infection is not the cause of blood in your urine, X-rays may be required. Your caregiver will discuss this with you. SEEK IMMEDIATE MEDICAL CARE IF:   You have a persistent fever over 102 F (38.9 C).  You develop severe vomiting and are unable to keep the medication down.  You develop severe back or abdominal pain despite taking your medications.  You begin passing a large amount of blood or clots in your urine.  You feel extremely weak or faint, or pass out. MAKE SURE YOU:   Understand these instructions.  Will watch your condition.  Will get help right away if you are not doing well or get worse. Document Released: 03/02/2005 Document Revised: 05/25/2011 Document Reviewed: 10/20/2007 [facility] Patient Information 2013 [place], [place].

## 2012-07-29 LAB — URINE CULTURE
Colony Count: NO GROWTH
Organism ID, Bacteria: NO GROWTH

## 2012-08-02 ENCOUNTER — Telehealth: Payer: Self-pay | Admitting: Family Medicine

## 2012-08-02 NOTE — Telephone Encounter (Signed)
Patient is aware of his appointment to see Dr. Sherron Monday on 08/26/12 @ 130 pm. [facility] [phone]

## 2012-10-11 ENCOUNTER — Encounter: Payer: Self-pay | Admitting: Cardiology

## 2012-10-11 NOTE — Progress Notes (Signed)
Patient ID: Nathan [name], male   DOB: 04-29-1960, 52 y.o.   MRN: [id number]   Nathan [name]    Date of visit:  10/11/2012 DOB:  23-Sep-1960    Age:  51 yrs. Medical record number:  [id number]     Account number:  [id number] Primary Care Provider: Ernst Breach ____________________________ CURRENT DIAGNOSES  1. Apical Hypertrophic Cardiomyopathy  2. Abnormal EKG  3. Obesity(BMI30-40)  4. Dyspnea ____________________________ ALLERGIES  No Known Drug Allergies ____________________________ MEDICATIONS  1. Crestor 10 mg tablet, 1 p.o. daily  2. metoprolol succinate 50 mg tablet extended release 24 hr, 1 p.o. daily ____________________________ CHIEF COMPLAINTS  Followup of Apical Hypertrophic Cardiomyopathy ____________________________ HISTORY OF PRESENT ILLNESS  Patient seen for cardiac followup of his known apical hypertrophic cardiomyopathy. He has been doing well since he was here without chest pain or significant shortness of breath. He has gained some weight. He has seen Dr. Graciela Husbands who did not recommend having a defibrillator. He states that his son has it and screening for hypertrophic cardiomyopathy with an echo and an EKG and that his daughter is in the process of trying to get this done. He had one episode of dizziness. He has no PND, orthopnea or claudication. ____________________________ PAST HISTORY  Past Medical Illnesses:  denies hypertension or diabetes;  Cardiovascular Illnesses:  no previous history of cardiac disease.;  Surgical Procedures:  no prior surgical procedures;  Cardiology Procedures-Invasive:  cardiac cath (left) December 2013;  Cardiology Procedures-Noninvasive:  echocardiogram December 2013, cardiac MRI December 2013;  Cardiac Cath Results:  normal coronary arteries;  LVEF of 65% documented via cardiac cath on 02/25/2012,   ____________________________ CARDIO-PULMONARY TEST DATES EKG Date:  02/22/2012;   Cardiac Cath Date:  02/25/2012;  Echocardiography Date:  02/24/2012;  Chest Xray Date: 02/22/2012;  CT Scan Date:  03/03/2012   ____________________________ SOCIAL HISTORY Alcohol Use:  occasionally;  Smoking:  never smoked;  Diet:  regular diet;  Lifestyle:  married;  Exercise:  no regular exercise;  Occupation:  Technical sales engineer;  Residence:  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 Cardiovascular:  please review HPI  Genitourinary-Male: no dysuria, urgency, frequency, or nocturia  Musculoskeletal:  arthritis of the knees Psychiatric:  insomnia, situational stress  ____________________________ PHYSICAL EXAMINATION VITAL SIGNS  Blood Pressure:  120/70 Sitting, Left arm, regular cuff   Pulse:  56/min. Weight:  237.00 lbs. Height:  73"BMI: 31  Constitutional:  pleasant white male in no acute distress, mildly obese Skin:  warm and dry to touch, no apparent skin lesions, or masses noted. Head:  normocephalic, normal hair pattern, no masses or tenderness Chest:  normal symmetry, clear to auscultation and percussion. Cardiac:  regular rhythm, normal S1 and S2, No S3 or S4, no murmurs, gallops or rubs detected. Extremities & Back:  no deformities, clubbing, cyanosis, erythema or edema observed. Normal muscle strength and tone. ___________________________ IMPRESSIONS/PLAN  1. Apical hypertrophic cardiomyopathy 2. Obesity does need to lose weight  Recommendations:  Clinically he is doing well at this time and recommended followup in 6 months. Discussed importance as family screening. ____________________________ TODAYS ORDERS  1. Return Visit: 6 months  2. 12 Lead EKG: 6 months                       ____________________________ Cardiology Physician:  Darden Palmer MD [facility]

## 2013-04-11 ENCOUNTER — Encounter: Payer: Self-pay | Admitting: Cardiology

## 2013-04-11 NOTE — Progress Notes (Unsigned)
Patient ID: Undrea Shipes, male   DOB: 1960/11/21, 53 y.o.   MRN: [id number]   Claudis, Giovanelli    Date of visit:  04/11/2013 DOB:  [birth year]    Age:  53 yrs. Medical record number:  [id number]     Account number:  [id number] Primary Care Provider: Robyne Askew ____________________________ CURRENT DIAGNOSES  1. Apical Hypertrophic Cardiomyopathy  2. Abnormal EKG  3. Obesity(BMI30-40)  4. Hypertrophic cardiomyopathy  5. Dyspnea ____________________________ ALLERGIES  No Known Drug Allergies ____________________________ MEDICATIONS  1. metoprolol succinate ER 50 mg tablet,extended release 24 hr, 1 p.o. daily  2. Crestor 10 mg tablet, 1 p.o. daily ____________________________ CHIEF COMPLAINTS  Followup of Apical Hypertrophic Cardiomyopathy ____________________________ HISTORY OF PRESENT ILLNESS  Patient seen for cardiac followup. He has been doing well since he was previously here. He has not really had any dizziness although he complains of fatigue. He has had no syncope and denies PND, orthopnea, claudication, or palpitations. His children were screened for cardiomyopathy in their echocardiograms are normal at the present time. ____________________________ PAST HISTORY  Past Medical Illnesses:  denies hypertension or diabetes;  Cardiovascular Illnesses:  apical hypertrophic cardiomyopathy;  Surgical Procedures:  no prior surgical procedures;  Cardiology Procedures-Invasive:  cardiac cath (left) December 2013;  Cardiology Procedures-Noninvasive:  echocardiogram December 2013, cardiac MRI December 2013;  Cardiac Cath Results:  normal coronary arteries;  LVEF of 65% documented via cardiac cath on 02/25/2012,   ____________________________ CARDIO-PULMONARY TEST DATES EKG Date:  04/11/2013;   Cardiac Cath Date:  02/25/2012;  Echocardiography Date: 02/24/2012;  Chest Xray Date: 02/22/2012;  CT Scan Date:  03/03/2012   ____________________________ FAMILY HISTORY Brother -- Hypertension,  Brother alive with problem Father -- Diabetes mellitus, Coronary Artery Disease, Father alive with problem Mother -- Mother alive and well ____________________________ SOCIAL HISTORY Alcohol Use:  occasionally;  Smoking:  never smoked;  Diet:  regular diet;  Lifestyle:  married;  Exercise:  no regular exercise;  Occupation:  Clinical cytogeneticist;  Residence:  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 Cardiovascular:  please review HPI  Genitourinary-Male: no dysuria, urgency, frequency, or nocturia  Musculoskeletal:  arthritis of the knees Psychiatric:  insomnia, situational stress  ____________________________ PHYSICAL EXAMINATION VITAL SIGNS  Blood Pressure:  124/70 Sitting, Left arm, regular cuff  , 120/70 Standing, Left arm and regular cuff   Pulse:  76/min. Weight:  242.00 lbs. Height:  73"BMI: 32  Constitutional:  pleasant white male in no acute distress, mildly obese Skin:  warm and dry to touch, no apparent skin lesions, or masses noted. Head:  normocephalic, normal hair pattern, no masses or tenderness Chest:  normal symmetry, clear to auscultation. Cardiac:  regular rhythm, normal S1 and S2, No S3 or S4, no murmurs, gallops or rubs detected. Extremities & Back:  no deformities, clubbing, cyanosis, erythema or edema observed. Normal muscle strength and tone. ____________________________ IMPRESSIONS/PLAN  1. Apical hypertrophic cardiomyopathy with no symptoms 2. Obesity with weight gain since here 3. Hyperlipidemia  Recommendations:  Recommended importance of weight loss. He is otherwise doing well we'll see in followup in one year. Call if problems. ____________________________ TODAYS ORDERS  1. 12 Lead EKG: Today  2. Return Visit: 1 year                       ____________________________ Cardiology Physician:  Kerry Hough  MD [facility]

## 2013-10-25 ENCOUNTER — Encounter: Payer: Self-pay | Admitting: Cardiology

## 2013-10-25 NOTE — Progress Notes (Signed)
Patient ID: Nathan [name], male   DOB: 1961-02-10, 53 y.o.   MRN: [id number]   Nathan [name], Nathan [name]    Date of visit:  10/25/2013 DOB:  Jun 11, 1960    Age:  53 yrs. Medical record number:  [id number]     Account number:  [id number] Primary Care Provider: Robyne Askew ____________________________ CURRENT DIAGNOSES  1. Apical Hypertrophic Cardiomyopathy  2. Dizziness  3. Abnormal EKG  4. Obesity(BMI30-40) ____________________________ ALLERGIES  No Known Drug Allergies ____________________________ MEDICATIONS  1. metoprolol succinate ER 50 mg tablet,extended release 24 hr, 1 p.o. daily  2. Crestor 10 mg tablet, 1 p.o. daily ____________________________ CHIEF COMPLAINTS  Dizziness  Trouble taking a deep breath ____________________________ HISTORY OF PRESENT ILLNESS Patient was seen early for evaluation of dizziness and vague dyspnea. Several days ago he had an episode where he felt as if his vision was stating when he would stand up and developed some slight dizziness. This happened on 3 or 4 occasions and then got better. He never did pass out. It was not associated with symptoms of chest pain or shortness of breath. It seemed always occur when he was standing up. He felt as if he had some trouble catching a deep breath and wished to be seen today. He is not having any chest pain suggestive of angina. He denies PND orthopnea or edema. He has felt somewhat better the past few days. ____________________________ PAST HISTORY  Past Medical Illnesses:  denies hypertension or diabetes;  Cardiovascular Illnesses:  apical hypertrophic cardiomyopathy;  Surgical Procedures:  no prior surgical procedures;  Cardiology Procedures-Invasive:  cardiac cath (left) December 2013;  Cardiology Procedures-Noninvasive:  echocardiogram December 2013, cardiac MRI December 2013;  Cardiac Cath Results:  normal coronary arteries;  LVEF of 65% documented via cardiac cath on 02/25/2012,    ____________________________ CARDIO-PULMONARY TEST DATES EKG Date:  10/25/2013;   Cardiac Cath Date:  02/25/2012;  Echocardiography Date: 02/24/2012;  Chest Xray Date: 02/22/2012;  CT Scan Date:  03/03/2012   ____________________________ FAMILY HISTORY Brother -- Brother alive with problem, Hypertension Father -- Father alive with problem, Diabetes mellitus, Coronary Artery Disease Mother -- Mother alive and well ____________________________ SOCIAL HISTORY Alcohol Use:  occasionally;  Smoking:  never smoked;  Diet:  regular diet;  Lifestyle:  married;  Exercise:  no regular exercise;  Occupation:  Clinical cytogeneticist;  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 Respiratory: denies dyspnea, cough, wheezing or hemoptysis. Cardiovascular:  please review HPI  Genitourinary-Male: no dysuria, urgency, frequency, or nocturia  Musculoskeletal:  arthritis of the knees Psychiatric:  insomnia, situational stress  ____________________________ PHYSICAL EXAMINATION VITAL SIGNS  Blood Pressure:  104/60 Sitting, Left arm, regular cuff  , 108/66 Standing, Left arm and regular cuff   Pulse:  62/min. Weight:  240.50 lbs. Height:  73"BMI: 31  Constitutional:  pleasant white male in no acute distress, mildly obese Skin:  warm and dry to touch, no apparent skin lesions, or masses noted. Head:  normocephalic, normal hair pattern, no masses or tenderness Neck:  supple, without massess. No JVD, thyromegaly or carotid bruits. Carotid upstroke normal. Chest:  normal symmetry, clear to auscultation. Cardiac:  regular rhythm, normal S1 and S2, No S3 or S4, no murmurs, gallops or rubs detected. Extremities & Back:  no deformities, clubbing, cyanosis, erythema or edema observed. Normal muscle strength and tone. ____________________________ IMPRESSIONS/PLAN  1. Episodic dizziness-evaluate for arrhythmia or worsening  of apical hypertrophic cardiomyopathy  2. Apical hypertrophic cardiomyopathy 3. Obesity  Recommendations:  Uncertain what the cause of dizziness this. His EKG shows significant T-wave changes but unchanged from before. Obtain lab as noted below and obtain echocardiogram as well as event monitor. Followup afterwards. ____________________________ TODAYS ORDERS  1. 12 Lead EKG: Today  2. Comprehensive Metabolic Panel: Today  3. CBC w/out Diff: Today  4. 2D, color flow, doppler: First Available  5. [facility]: Today                       ____________________________ Cardiology Physician:  Kerry Hough MD [facility]

## 2013-10-28 ENCOUNTER — Telehealth: Payer: Self-pay | Admitting: Internal Medicine

## 2013-10-28 NOTE — Telephone Encounter (Signed)
Nathan [name] had 16 beats of non-sustained VT at a rate of 195 beats per minute. This corresponded with an episode of dizziness that the patient had.

## 2013-11-21 ENCOUNTER — Encounter: Payer: Self-pay | Admitting: Medical

## 2013-11-22 ENCOUNTER — Inpatient Hospital Stay (HOSPITAL_COMMUNITY)
Admission: EM | Admit: 2013-11-22 | Discharge: 2013-11-24 | DRG: 227 | Disposition: A | Discharge status: Home / Self Care | Payer: 59 | Attending: Cardiology | Admitting: Cardiology

## 2013-11-22 ENCOUNTER — Encounter (HOSPITAL_COMMUNITY): Payer: Self-pay | Admitting: Emergency Medicine

## 2013-11-22 ENCOUNTER — Emergency Department (HOSPITAL_COMMUNITY): Payer: 59

## 2013-11-22 DIAGNOSIS — Z0389 Encounter for observation for other suspected diseases and conditions ruled out: Secondary | ICD-10-CM

## 2013-11-22 DIAGNOSIS — Z833 Family history of diabetes mellitus: Secondary | ICD-10-CM | POA: Diagnosis not present

## 2013-11-22 DIAGNOSIS — Z6832 Body mass index (BMI) 32.0-32.9, adult: Secondary | ICD-10-CM

## 2013-11-22 DIAGNOSIS — IMO0001 Reserved for inherently not codable concepts without codable children: Secondary | ICD-10-CM

## 2013-11-22 DIAGNOSIS — I472 Ventricular tachycardia, unspecified: Principal | ICD-10-CM | POA: Diagnosis present

## 2013-11-22 DIAGNOSIS — E785 Hyperlipidemia, unspecified: Secondary | ICD-10-CM | POA: Diagnosis present

## 2013-11-22 DIAGNOSIS — I422 Other hypertrophic cardiomyopathy: Secondary | ICD-10-CM | POA: Diagnosis present

## 2013-11-22 DIAGNOSIS — E669 Obesity, unspecified: Secondary | ICD-10-CM | POA: Diagnosis present

## 2013-11-22 DIAGNOSIS — F411 Generalized anxiety disorder: Secondary | ICD-10-CM | POA: Diagnosis present

## 2013-11-22 DIAGNOSIS — I4729 Other ventricular tachycardia: Secondary | ICD-10-CM | POA: Diagnosis present

## 2013-11-22 DIAGNOSIS — R61 Generalized hyperhidrosis: Secondary | ICD-10-CM | POA: Diagnosis present

## 2013-11-22 DIAGNOSIS — Z8249 Family history of ischemic heart disease and other diseases of the circulatory system: Secondary | ICD-10-CM | POA: Diagnosis not present

## 2013-11-22 DIAGNOSIS — R55 Syncope and collapse: Secondary | ICD-10-CM | POA: Diagnosis present

## 2013-11-22 LAB — CBC
HCT: 45.9 % (ref 39.0–52.0)
Hemoglobin: 16.3 g/dL (ref 13.0–17.0)
MCH: 31.3 pg (ref 26.0–34.0)
MCHC: 35.5 g/dL (ref 30.0–36.0)
MCV: 88.1 fL (ref 78.0–100.0)
Platelets: 196 10*3/uL (ref 150–400)
RBC: 5.21 MIL/uL (ref 4.22–5.81)
RDW: 12.7 % (ref 11.5–15.5)
WBC: 9.8 10*3/uL (ref 4.0–10.5)

## 2013-11-22 LAB — BASIC METABOLIC PANEL
Anion gap: 13 (ref 5–15)
BUN: 21 mg/dL (ref 6–23)
CO2: 22 meq/L (ref 19–32)
Calcium: 9.4 mg/dL (ref 8.4–10.5)
Chloride: 104 mEq/L (ref 96–112)
Creatinine, Ser: 1.04 mg/dL (ref 0.50–1.35)
GFR calc Af Amer: >90 mL/min (ref >90)
GFR calc non Af Amer: 79 mL/min — ABNORMAL LOW (ref >90)
GLUCOSE: 97 mg/dL (ref 70–99)
POTASSIUM: 4.5 meq/L (ref 3.7–5.3)
SODIUM: 139 meq/L (ref 137–147)

## 2013-11-22 LAB — I-STAT TROPONIN, ED: TROPONIN I, POC: 0.11 ng/mL — AB (ref 0.00–0.08)

## 2013-11-22 LAB — PRO B NATRIURETIC PEPTIDE: PRO B NATRI PEPTIDE: 432.3 pg/mL — AB (ref 0–125)

## 2013-11-22 LAB — TSH: TSH: 3.44 u[IU]/mL (ref 0.350–4.500)

## 2013-11-22 LAB — PROTIME-INR
INR: 1.08 (ref 0.00–1.49)
PROTHROMBIN TIME: 14 s (ref 11.6–15.2)

## 2013-11-22 MED ORDER — ONDANSETRON HCL 4 MG PO TABS: 4.0000 mg | ORAL_TABLET | Freq: Four times a day (QID) | ORAL | Status: DC | PRN

## 2013-11-22 MED ORDER — ASPIRIN 81 MG PO CHEW
324.0000 mg | CHEWABLE_TABLET | Freq: Once | ORAL | Status: AC
  Administered 2013-11-22: 324 mg via ORAL
  Filled 2013-11-22: qty 4

## 2013-11-22 MED ORDER — ONDANSETRON HCL 4 MG/2ML IJ SOLN: 4.0000 mg | Freq: Four times a day (QID) | INTRAMUSCULAR | Status: DC | PRN

## 2013-11-22 MED ORDER — ATORVASTATIN CALCIUM 20 MG PO TABS
20.0000 mg | ORAL_TABLET | Freq: Every day | ORAL | Status: DC
  Administered 2013-11-23: 20 mg via ORAL
  Filled 2013-11-22 (×2): qty 1

## 2013-11-22 MED ORDER — SODIUM CHLORIDE 0.9 % IV SOLN
INTRAVENOUS | Status: DC
  Administered 2013-11-22: 23:00:00 via INTRAVENOUS

## 2013-11-22 MED ORDER — METOPROLOL SUCCINATE ER 50 MG PO TB24
50.0000 mg | ORAL_TABLET | Freq: Every day | ORAL | Status: DC
  Administered 2013-11-23 – 2013-11-24 (×2): 50 mg via ORAL
  Filled 2013-11-22 (×2): qty 1

## 2013-11-22 MED ORDER — SODIUM CHLORIDE 0.9 % IJ SOLN
3.0000 mL | Freq: Two times a day (BID) | INTRAMUSCULAR | Status: DC
  Administered 2013-11-22: 3 mL via INTRAVENOUS

## 2013-11-22 MED ORDER — ACETAMINOPHEN 650 MG RE SUPP: 650.0000 mg | Freq: Four times a day (QID) | RECTAL | Status: DC | PRN

## 2013-11-22 MED ORDER — ACETAMINOPHEN 325 MG PO TABS: 650.0000 mg | ORAL_TABLET | Freq: Four times a day (QID) | ORAL | Status: DC | PRN

## 2013-11-22 MED ORDER — DOCUSATE SODIUM 100 MG PO CAPS
100.0000 mg | ORAL_CAPSULE | Freq: Two times a day (BID) | ORAL | Status: DC
  Administered 2013-11-23 (×2): 100 mg via ORAL
  Filled 2013-11-22 (×5): qty 1

## 2013-11-22 NOTE — ED Notes (Addendum)
Per EMS pt c/o chest fluttering that started today around 1400 pt was diaphoretic/pale on arrival. Pt has been on cardiac monitor for 4 weeks per cardiologist order. Pt sts he received call from the nurse when he had first fluttering in chest around 1400 and reading was v-tach. Pt sts fluttering went away and was told my cardiac monitor nurse fluttering happened again for him to call 911. Pt has second episode around 1645 and called 911. EMS vitals BP 110/90, HR 90, O2 97% Rm Air EKG ST.
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
## 2013-11-22 NOTE — ED Notes (Signed)
CRITICAL VALUE ALERT  Critical value received:  Troponin 0.11  Date of notification:  11/22/2013  Time of notification:  1950  Critical value read back: Yes   Nurse who received alert:  Elyn Peers   MD notified (1st page):  Md Pfeiffer
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
## 2013-11-23 ENCOUNTER — Encounter (HOSPITAL_COMMUNITY)
Admission: EM | Disposition: A | Discharge status: Home / Self Care | Payer: Self-pay | Source: Home / Self Care | Attending: Cardiology

## 2013-11-23 DIAGNOSIS — I472 Ventricular tachycardia: Principal | ICD-10-CM

## 2013-11-23 DIAGNOSIS — I517 Cardiomegaly: Secondary | ICD-10-CM

## 2013-11-23 DIAGNOSIS — I428 Other cardiomyopathies: Secondary | ICD-10-CM

## 2013-11-23 DIAGNOSIS — I4729 Other ventricular tachycardia: Secondary | ICD-10-CM

## 2013-11-23 HISTORY — PX: IMPLANTABLE CARDIOVERTER DEFIBRILLATOR IMPLANT: SHX5473

## 2013-11-23 LAB — BASIC METABOLIC PANEL
Anion gap: 12 (ref 5–15)
BUN: 18 mg/dL (ref 6–23)
CALCIUM: 8.4 mg/dL (ref 8.4–10.5)
CO2: 24 mEq/L (ref 19–32)
Chloride: 102 mEq/L (ref 96–112)
Creatinine, Ser: 1.04 mg/dL (ref 0.50–1.35)
GFR calc Af Amer: >90 mL/min (ref >90)
GFR, EST NON AFRICAN AMERICAN: 79 mL/min — AB (ref >90)
GLUCOSE: 110 mg/dL — AB (ref 70–99)
Potassium: 3.8 mEq/L (ref 3.7–5.3)
Sodium: 138 mEq/L (ref 137–147)

## 2013-11-23 LAB — CBC
HEMATOCRIT: 43.1 % (ref 39.0–52.0)
Hemoglobin: 15 g/dL (ref 13.0–17.0)
MCH: 30.9 pg (ref 26.0–34.0)
MCHC: 34.8 g/dL (ref 30.0–36.0)
MCV: 88.7 fL (ref 78.0–100.0)
PLATELETS: 190 10*3/uL (ref 150–400)
RBC: 4.86 MIL/uL (ref 4.22–5.81)
RDW: 12.8 % (ref 11.5–15.5)
WBC: 8.9 10*3/uL (ref 4.0–10.5)

## 2013-11-23 LAB — PROTIME-INR
INR: 1.14 (ref 0.00–1.49)
Prothrombin Time: 14.6 seconds (ref 11.6–15.2)

## 2013-11-23 SURGERY — IMPLANTABLE CARDIOVERTER DEFIBRILLATOR IMPLANT
Anesthesia: LOCAL

## 2013-11-23 MED ORDER — FENTANYL CITRATE 0.05 MG/ML IJ SOLN
INTRAMUSCULAR | Status: AC
  Filled 2013-11-23: qty 2

## 2013-11-23 MED ORDER — LIDOCAINE HCL (PF) 1 % IJ SOLN
INTRAMUSCULAR | Status: AC
  Filled 2013-11-23: qty 30

## 2013-11-23 MED ORDER — SODIUM CHLORIDE 0.9 % IJ SOLN
3.0000 mL | Freq: Two times a day (BID) | INTRAMUSCULAR | Status: DC
  Administered 2013-11-23: 23:00:00 via INTRAVENOUS

## 2013-11-23 MED ORDER — ACETAMINOPHEN 325 MG PO TABS: 325.0000 mg | ORAL_TABLET | ORAL | Status: DC | PRN

## 2013-11-23 MED ORDER — SODIUM CHLORIDE 0.9 % IV SOLN: 250.0000 mL | INTRAVENOUS | Status: DC | PRN

## 2013-11-23 MED ORDER — CHLORHEXIDINE GLUCONATE 4 % EX LIQD: 60.0000 mL | Freq: Once | CUTANEOUS | Status: DC

## 2013-11-23 MED ORDER — MIDAZOLAM HCL 5 MG/5ML IJ SOLN
INTRAMUSCULAR | Status: AC
  Filled 2013-11-23: qty 5

## 2013-11-23 MED ORDER — SODIUM CHLORIDE 0.9 % IJ SOLN
3.0000 mL | INTRAMUSCULAR | Status: DC | PRN
Start: 2013-11-23 — End: 2013-11-24

## 2013-11-23 MED ORDER — CEFAZOLIN SODIUM-DEXTROSE 2-3 GM-% IV SOLR
2.0000 g | INTRAVENOUS | Status: DC
  Filled 2013-11-23: qty 50

## 2013-11-23 MED ORDER — HYDROCODONE-ACETAMINOPHEN 5-325 MG PO TABS
1.0000 | ORAL_TABLET | ORAL | Status: DC | PRN
  Administered 2013-11-23: 23:00:00 1 via ORAL
  Administered 2013-11-24: 2 via ORAL
  Filled 2013-11-23: qty 2
  Filled 2013-11-23: qty 1

## 2013-11-23 MED ORDER — CHLORHEXIDINE GLUCONATE 4 % EX LIQD
60.0000 mL | Freq: Once | CUTANEOUS | Status: DC
  Filled 2013-11-23: qty 60

## 2013-11-23 MED ORDER — SODIUM CHLORIDE 0.9 % IR SOLN
Status: DC
  Filled 2013-11-23 (×2): qty 2

## 2013-11-23 MED ORDER — ONDANSETRON HCL 4 MG/2ML IJ SOLN: 4.0000 mg | Freq: Four times a day (QID) | INTRAMUSCULAR | Status: DC | PRN

## 2013-11-23 MED ORDER — LIDOCAINE HCL (PF) 1 % IJ SOLN
INTRAMUSCULAR | Status: AC
  Filled 2013-11-23: qty 60

## 2013-11-23 MED ORDER — CEFAZOLIN SODIUM 1-5 GM-% IV SOLN
1.0000 g | Freq: Four times a day (QID) | INTRAVENOUS | Status: AC
  Administered 2013-11-23 – 2013-11-24 (×3): 1 g via INTRAVENOUS
  Filled 2013-11-23 (×3): qty 50

## 2013-11-23 MED ORDER — SODIUM CHLORIDE 0.9 % IR SOLN
80.0000 mg | Status: DC
  Filled 2013-11-23: qty 2

## 2013-11-23 MED ORDER — HEPARIN (PORCINE) IN NACL 2-0.9 UNIT/ML-% IJ SOLN
INTRAMUSCULAR | Status: AC
  Filled 2013-11-23: qty 500

## 2013-11-23 MED ORDER — YOU HAVE A PACEMAKER BOOK
Freq: Once | Status: AC
  Administered 2013-11-23: 23:00:00
  Filled 2013-11-23: qty 1

## 2013-11-23 NOTE — Op Note (Signed)
SURGEON:  Thompson Grayer, MD      PREPROCEDURE DIAGNOSES:   1. Ventricular tachycardia   2. Hypertrophic cardiomyopathy     POSTPROCEDURE DIAGNOSES:   1. Ventricular tachycardia   2. Hypertrophic cardiomyopathy     PROCEDURES:    1. ICD implantation.  2. Defibrillation threshold testing     INTRODUCTION: Nathan [name] is a 53 y.o. male with apical variant hypertrophic cardiomyopathy.  He is admitted with symptomatic ventricular tachycardia.   At this time, he meets criteria for ICD implantation for primary prevention of sudden death.   He has sinus bradycardia and is felt to potentially require additional beta blockers and possibly antiarrhythmic treatment for VT in the future.  Dual chamber device is therefore advised.  The patient therefore  presents today for ICD implantation.      DESCRIPTION OF PROCEDURE:  Informed written consent was obtained and the patient was brought to the electrophysiology lab in the fasting state. The patient was adequately sedated with intravenous Versed, and fentanyl as outlined in the nursing report.  The patient's left chest was prepped and draped in the usual sterile fashion by the EP lab staff.  The skin overlying the left deltopectoral region was infiltrated with lidocaine for local analgesia.  A 5-cm incision was made over the left deltopectoral region.  A left subcutaneous defibrillator pocket was fashioned using a combination of sharp and blunt dissection.  Electrocautery was used to assure hemostasis.   RA/RV Lead Placement: The left axillary vein was cannulated with fluoroscopic visualization.  No contrast was required for this endeavor.  Through the left axillary vein, a [employer] Tendril model [id number] (serial # [id number]) right atrial lead and a [employer] Medical [place], model 7122Q-65 (serial number [id number]) right ventricular defibrillator lead were advanced with fluoroscopic visualization into the right atrial appendage and right ventricular  apex positions respectively.  Initial atrial lead P-waves measured 2.4 mV with an impedance of 800 ohms and a threshold of 1.5 volts at 0.5 milliseconds.  The right ventricular lead R-wave measured >35 mV with impedance of 622 ohms and a threshold of 0.8 volts at 0.5 milliseconds.   The leads were secured to the pectoralis  fascia using #2 silk suture over the suture sleeves.  The pocket then  irrigated with copious gentamicin solution.  The leads were then  connected to a Fraser DR model [phone] (serial  Number [id number]) ICD.  The defibrillator was placed into the  pocket.  The pocket was then closed in 2 layers with 2.0 Vicryl suture  for the subcutaneous and subcuticular layers.  EBL<63ml. Steri-Strips and a  sterile dressing were then applied.   DFT Testing: Defibrillation Threshold testing was then performed. Ventricular fibrillation was induced with a T shock.  Adequate sensing of ventricular  fibrillation was observed with minimal dropout with a programmed sensitivity of 1.53mV.  The patient was successfully defibrillated to sinus rhythm with a single 20 joules shock delivered from the device with an impedance of 47 ohms in a duration of 4.5 seconds.  The patient remained in sinus rhythm thereafter.  There were no early apparent complications.      CONCLUSIONS:   1. Hypertrophic (apical variant) cardiomyopathy with symptomatic ventricular tachycardia  2. Successful ICD implantation.   3. DFT less than or equal to 20 joules.   4. No early apparent complications.
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
## 2013-11-23 NOTE — Interval H&P Note (Signed)
ICD Criteria  Current LVEF:60% ;Obtained < 1 month ago.  NYHA Functional Classification: Class I  Heart Failure History:  No.  Non-Ischemic Dilated Cardiomyopathy History:  No.  Atrial Fibrillation/Atrial Flutter:  No.  Ventricular Tachycardia History:  Yes, Hemodynamic instability present, VT Type:  SVT - Monomorphic.  Cardiac Arrest History:  No  History of Syndromes with Risk of Sudden Death:  Yes, Other   Hypertrophic cardiomyopathy  Previous ICD:  No.  Electrophysiology Study: NO  Prior MI: No.  PPM: No.  OSA:  No  Patient Life Expectancy of >=1 year: Yes.  Anticoagulation Therapy:  Patient is NOT on anticoagulation therapy.   Beta Blocker Therapy:  Yes.   Ace Inhibitor/ARB Therapy:  No, Reason not on Ace Inhibitor/ARB therapy:  not indicated, preserved EFHistory and Physical Interval Note:  11/23/2013 3:58 PM  Nathan [name]  has presented today for surgery, with the diagnosis of cardiomyopathy  The various methods of treatment have been discussed with the patient and family. After consideration of risks, benefits and other options for treatment, the patient has consented to  Procedure(s): IMPLANTABLE CARDIOVERTER DEFIBRILLATOR IMPLANT (N/A) as a surgical intervention .  The patient's history has been reviewed, patient examined, no change in status, stable for surgery.  I have reviewed the patient's chart and labs.  Questions were answered to the patient's satisfaction.     Thompson Grayer

## 2013-11-23 NOTE — Progress Notes (Signed)
Echocardiogram 2D Echocardiogram limited has been performed.  Romir Klimowicz 11/23/2013, 4:34 PM

## 2013-11-23 NOTE — Progress Notes (Signed)
Utilization Review Completed.Donne Anon T9/12/2013

## 2013-11-23 NOTE — Discharge Summary (Signed)
ELECTROPHYSIOLOGY PROCEDURE DISCHARGE SUMMARY    Patient ID: Nathan [name],  MRN: [id number], DOB/AGE: 1960-12-26 53 y.o.  Admit date: 11/22/2013 Discharge date: 11/24/2013  Primary Care Physician: Wyatt Haste, MD Primary Cardiologist: Wynonia Lawman Electrophysiologist: Caryl Comes  Primary Discharge Diagnosis:  Apical variant hypertrophic cardiomyopathy with symptomatic ventricular tachycardia s/p ICD implantation this admission  Secondary Discharge Diagnosis:  1.  Prior syncope (felt to be vasovagal) 2.  Arthritis 3.  Hyperlipidemia  No Known Allergies   Procedures This Admission:  1.  Implantation of a STJ dual chamber ICD on 11-23-13 by Dr Rayann Heman.  The patient received a STJ model number [phone] ICD with model number 2088 right atrial lead and 0347Q right ventricular lead.  DFT's were successful at 93 J.  There were no immediate post procedure complications. 2.  CXR on 11-24-13 demonstrated no pneumothorax status post device implantation.  Brief HPI/Hospital Course:  Nathan [name] is a 53 y.o. male with past medical history significant for apical hypertrophic cardiomyopathy, prior syncope (felt to be vasovagal), arthritis, and hyperlipidemia. He is followed by Dr Wynonia Lawman closely. He was first diagnosed with HCM in 2013 when he was evaluated for DOE and abnormal electrocardiogram. Cardiac catheterization at that time demonstrated normal coronaries. MRI scan demonstrated apical HCM with scarring. He has been maintained on metoprolol since that time. He underwent holter monitoring which did not demonstrate NSVT and so ICD implantation was not indicated at that time.   He has since done well until recently when he has developed periodic "dizzy spells". An event monitor was placed for further evaluation. On the day of admission, he developed diaphoresis, chest tightness and a general feeling of unwell. He activated his event monitor and was told that it demonstrated ventricular  tachycardia. He was advised to go to the ER for further evaluation if he had recurrent symptoms. He was getting his hair cut when he developed similar prodrome and asked his hairdresser to call 911. When they arrived 6 minutes later, he had returned to normal rhythm. Event monitor strips are reviewed and demonstrate ventricular tachycardia at a cycle length of around 359msec. He was brought to [facility] for further evaluation.  Telemetry has demonstrated predominately sinus rhythm in the 50's with short runs of NSVT.  He was evaluated by EP who recommended ICD implant.  Risks, benefits, and alternatives were reviewed with the patient who wished to proceed.    The patient underwent implantation of a STJ dual chamber ICD with details as outlined above.   He was monitored on telemetry overnight which demonstrated NSR.  Left chest was without hematoma or ecchymosis.  The device was interrogated and found to be functioning normally.  CXR was obtained and demonstrated no pneumothorax status post device implantation.  Wound care, arm mobility, and restrictions were reviewed with the patient.  Dr Rayann Heman examined the patient and considered them stable for discharge to home.   The patient's discharge medications include a beta blocker (Metoprolol).  ACE-I not indicated with normal LV function.   Discharge Vitals: Blood pressure 120/65, pulse 61, temperature 97.5 F (36.4 C), temperature source Oral, resp. rate 16, height 6' (1.829 m), weight 242 lb 11.6 oz (110.1 kg), SpO2 96.00%.  Labs:   Lab Results  Component Value Date   WBC 8.9 11/23/2013   HGB 15.0 11/23/2013   HCT 43.1 11/23/2013   MCV 88.7 11/23/2013   PLT 190 11/23/2013     Recent Labs Lab 11/23/13 0500  NA 138  K 3.8  CL  102  CO2 24  BUN 18  CREATININE 1.04  CALCIUM 8.4  GLUCOSE 110*     Discharge Medications:    Medication List    ASK your doctor about these medications       CRESTOR 10 MG tablet  Generic drug:  rosuvastatin  Take 10  mg by mouth daily.     metoprolol succinate 50 MG 24 hr tablet  Commonly known as:  TOPROL-XL  Take 50 mg by mouth daily.        Disposition:   Follow-up Information   Follow up with [facility] 1 On 12/06/2013. (10:00 am)      Follow up in 3 months with Dr Rayann Heman to be arranged   Duration of Discharge Encounter: Greater than 30 minutes including physician time.  Signed,   Thompson Grayer MD
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
## 2013-11-24 ENCOUNTER — Inpatient Hospital Stay (HOSPITAL_COMMUNITY): Payer: 59

## 2013-11-24 DIAGNOSIS — IMO0001 Reserved for inherently not codable concepts without codable children: Secondary | ICD-10-CM

## 2013-11-24 DIAGNOSIS — Z0389 Encounter for observation for other suspected diseases and conditions ruled out: Secondary | ICD-10-CM

## 2013-11-24 DIAGNOSIS — E785 Hyperlipidemia, unspecified: Secondary | ICD-10-CM | POA: Diagnosis present

## 2013-11-24 HISTORY — DX: Reserved for inherently not codable concepts without codable children: IMO0001

## 2013-11-24 MED ORDER — ACETAMINOPHEN 325 MG PO TABS: 325.0000 mg | ORAL_TABLET | ORAL | Status: DC | PRN

## 2013-11-24 MED ORDER — INFLUENZA VAC SPLIT QUAD 0.5 ML IM SUSY
0.5000 mL | PREFILLED_SYRINGE | Freq: Once | INTRAMUSCULAR | Status: AC
  Administered 2013-11-24: 0.5 mL via INTRAMUSCULAR
  Filled 2013-11-24: qty 0.5

## 2013-11-24 NOTE — Discharge Instructions (Signed)
Cardioverter Defibrillator Implantation, Care After Refer to this sheet in the next few weeks. These instructions provide you with information on caring for yourself after your procedure. Your health care provider may also give you more specific instructions. Your treatment has been planned according to current medical practices, but problems sometimes occur. Call your health care provider if you have any problems or questions after your procedure.  WHAT TO EXPECT AFTER THE PROCEDURE  You may feel pain. Some pain is normal. It may last a few days.  A slight bump may be seen over the skin where the device was placed. Sometimes, it is possible to feel the device under the skin. This is normal.  In the months and years afterward, your health care provider will check the device, the leads, and the battery every few months. Eventually, when the battery is low, the device will be replaced. HOME CARE INSTRUCTIONS  Medicines  Take medicines only as directed by your health care provider.  If you were prescribed an antibiotic medicine, finish it all even if you start to feel better.   Do not take any other medicines without asking your health care provider first. Some medicines, including certain painkillers, can cause bleeding after surgery.  Wound Care  Do not remove the bandage on your chest until directed to do so by your health care provider.  Once your bandage is removed, you may see pieces of tape called skin adhesive strips over the area where the cut was made (incision site). Let them fall off on their own.   Check the incision site every day to make sure it is not infected, bleeding, or starting to pull apart.  Do not use lotions or ointments near the incision site unless directed to do so.   Keep the incision area clean and dry for 2-3 days after the procedure or as directed by your health care provider. It takes several weeks for the incision site to completely heal.   Do not  take baths, swim, or use a hot tub until your health care provider approves. Activities  Try to walk a little every day. Exercising is important after this procedure. It is also important to use your shoulder on the side of the pacemaker in daily tasks that do not require exaggerated motion.  Avoid sudden jerking, pulling, or chopping movements that pull your upper arm far away from your body for at least 6 weeks.  Do not lift your upper arm above your shoulders for at least 6 weeks. This means no tennis, golf, or swimming for this period of time. If you sleep with the arm above your head, use a restraint to prevent this from happening as you sleep.  You may go back to work when your health care provider says it is okay. Check with your health care provider before you start to drive or play sports.  Other Instructions  Follow diet instructions if they were provided. You should be able to eat what you usually do right away, but you may need to limit your salt intake.   Weigh yourself every day. If you suddenly gain weight, fluid may be building up in your body.   Always carry your pacemaker identification card with you. The card should list the implant date, device model, and manufacturer. Consider wearing a medical alert bracelet or necklace.  Tell all health care providers that you have a pacemaker. This may prevent them from giving you a magnetic resonance imaging (MRI) scan because of  the strong magnets used during that test.  If you must pass through a metal detector, quickly walk through it. Do not stop under the detector or stand near it.  Avoid places or objects with a strong electric or magnetic field, including:   Engineer, maintenance. When at the airport, let officials know you have a pacemaker. Your ID card will let you be checked in a way that is safe for you and that will not damage your pacemaker. Also, do not let a security person wave a magnetic wand near your  pacemaker. That can make it stop working.  Power plants.   Large electrical generators.   Radiofrequency transmission towers, such as cell phone and radio towers.   Do not use amateur (ham) radio equipment or electric (arc) welding torches. Some devices are safe to use if held at least 1 foot from your pacemaker. These include power tools, lawn mowers, and speakers. If you are unsure of whether something is safe to use, ask your health care provider.   You may safely use electric blankets, heating pads, computers, and microwave ovens.   When using your cell phone, hold it to the ear opposite the pacemaker. Do not leave your cell phone in a pocket over the pacemaker.   Keep all follow-up visits as directed by your health care provider. This is how your health care provider makes sure your chest is healing the way it should. Ask your health care provider when you should come back to have your stitches or staples taken out.   Have your pacemaker checked every 3-6 months or as directed by your health care provider. Most pacemakers last for 4-8 years before a new one is needed. SEEK MEDICAL CARE IF:   You feel one shock in your chest.  You gain weight suddenly.   Your legs or feet swell more than they have before.   It feels like your heart is fluttering or skipping beats (heart palpitations).  You have a fever. SEEK IMMEDIATE MEDICAL CARE IF:   You have chest pain.  You feel more than one shock.  You feel more short of breath than you have felt before.  You feel more light-headed than you have felt before.  You have problems with your incision site, such as swelling or bleeding, or it starts to open up.   You notice signs of infection around your incision site. Watch for:   Warmth.   Redness.   Worsening pain.   Swelling.   Fluid leaking from the incision site.  Document Released: 09/19/2004 Document Revised: 07/17/2013 Document Reviewed:  03/23/2012 [facility] Patient Information 2015 [place], [place]. This information is not intended to replace advice given to you by your health care provider. Make sure you discuss any questions you have with your health care provider.  No driving for 10 days

## 2013-11-24 NOTE — Progress Notes (Signed)
    Subjective:  Sore, otherwise no complaints  Objective:  Vital Signs in the last 24 hours: Temp:  [97.5 F (36.4 C)-98.6 F (37 C)] 97.5 F (36.4 C) (09/11 0758) Pulse Rate:  [53-65] 61 (09/11 0758) Resp:  [16-18] 16 (09/11 0758) BP: (98-121)/(59-77) 120/65 mmHg (09/11 0758) SpO2:  [92 %-96 %] 96 % (09/11 0758) Weight:  [242 lb 11.6 oz (110.1 kg)] 242 lb 11.6 oz (110.1 kg) (09/11 0013)  Intake/Output from previous day:  Intake/Output Summary (Last 24 hours) at 11/24/13 0949 Last data filed at 11/24/13 0759  Gross per 24 hour  Intake    380 ml  Output    600 ml  Net   -220 ml    Physical Exam: General appearance: alert, cooperative, no distress and moderately obese Lungs: clear to auscultation bilaterally Heart: regular rate and rhythm ICD dressing intact, dry   Rate: 60  Rhythm: normal sinus rhythm and sinus bradycardia  Lab Results:  Recent Labs  11/22/13 1911 11/23/13 0500  WBC 9.8 8.9  HGB 16.3 15.0  PLT 196 190    Recent Labs  11/22/13 1911 11/23/13 0500  NA 139 138  K 4.5 3.8  CL 104 102  CO2 22 24  GLUCOSE 97 110*  BUN 21 18  CREATININE 1.04 1.04   No results found for this basename: TROPONINI, CK, MB,  in the last 72 hours  Recent Labs  11/23/13 0500  INR 1.14    Imaging: Dg Chest 2 View  11/24/2013   CLINICAL DATA:  Status post pacemaker insertion  EXAM: CHEST  2 VIEW  COMPARISON:  11/22/2013  FINDINGS: A defibrillator is now seen. The leads are in adequate position. No pneumothorax is noted. Chronic changes in the left chest wall are again seen. The lungs are clear. The cardiac shadow is stable.  IMPRESSION: No pneumothorax following defibrillator placement.   Electronically Signed   By: Inez Catalina M.D.   On: 11/24/2013 07:19   Dg Chest Port 1 View  11/22/2013   CLINICAL DATA:  Chest pain  EXAM: PORTABLE CHEST - 1 VIEW  COMPARISON:  02/22/2012.  FINDINGS: 1925 hrs. Lungs are clear bilaterally. The cardio pericardial silhouette is  enlarged. Defibrillator pads overlie the left chest. Telemetry leads overlie the chest. Imaged bony structures of the thorax are intact.  IMPRESSION: Cardiomegaly without acute cardiopulmonary findings.   Electronically Signed   By: Misty Stanley M.D.   On: 11/22/2013 19:48    Cardiac Studies:  Assessment/Plan:    Principal Problem:   Ventricular tachycardia Active Problems:   Apical variant hypertrophic cardiomyopathy   Obesity (BMI 30-39.9)   Normal coronary arteries- Dec 2013   Dyslipidemia    PLAN: Should be OK for discharge. Device clinic for f/u in 10 days, Dr Joylene Grapes in 3 months.   Kerin Ransom PA-C [facility] [phone] 11/24/2013, 9:49 AM   I have seen, examined the patient, and reviewed the above assessment and plan.  Changes to above are made where necessary.  Device interrogation is reviewed and normal.  CXR reveals stable leads, no ptx.  OK to discharge. No driving (pt is aware)  Routine wound care and follow-up  Co Sign: Thompson Grayer, MD 11/24/2013 9:58 AM

## 2013-11-27 ENCOUNTER — Telehealth: Payer: Self-pay | Admitting: *Deleted

## 2013-11-27 NOTE — Telephone Encounter (Signed)
Pt was called to due to episode on 11-24-13 around 741pm. Pt had no recollection of episode. No symptoms. Pt taking Metoprolol 50mg  qd--per pt no missed doses. Pt was instructed no driving x 6 mths  Per DMV recommendations. Pt aware. Episode will be showed to [place].

## 2013-11-27 NOTE — Telephone Encounter (Signed)
Per JA--increase Metoprolol 50mg  to 75mg  qd. Pt aware.

## 2013-11-28 ENCOUNTER — Telehealth: Payer: Self-pay | Admitting: Internal Medicine

## 2013-11-28 NOTE — Telephone Encounter (Signed)
Spoke with pt about the issue and notified Bryan w/[employer]. Gaspar Bidding stated that he was going to send me e-mail with correct information to give to pt.

## 2013-11-28 NOTE — Telephone Encounter (Signed)
New message          Pt [facility] medical card has the wrong information on it / pt would like some clarity on this

## 2013-11-29 ENCOUNTER — Telehealth: Payer: Self-pay | Admitting: Internal Medicine

## 2013-11-29 NOTE — Telephone Encounter (Signed)
Spoke w/ pt and answered all questions.

## 2013-11-29 NOTE — Telephone Encounter (Signed)
New message      Pt had pacemaker put in Thursday----when can he take a shower?

## 2013-11-30 ENCOUNTER — Encounter: Payer: Self-pay | Admitting: Internal Medicine

## 2013-12-05 ENCOUNTER — Encounter: Payer: Self-pay | Admitting: Internal Medicine

## 2013-12-06 ENCOUNTER — Encounter: Payer: Self-pay | Admitting: Internal Medicine

## 2013-12-06 ENCOUNTER — Ambulatory Visit (INDEPENDENT_AMBULATORY_CARE_PROVIDER_SITE_OTHER): Payer: 59 | Admitting: *Deleted

## 2013-12-06 DIAGNOSIS — I472 Ventricular tachycardia, unspecified: Secondary | ICD-10-CM

## 2013-12-06 DIAGNOSIS — I4729 Other ventricular tachycardia: Secondary | ICD-10-CM

## 2013-12-06 LAB — MDC_IDC_ENUM_SESS_TYPE_INCLINIC
Brady Statistic RA Percent Paced: 15 %
Brady Statistic RV Percent Paced: 0.04 %
Date Time Interrogation Session: 20150923103230
HighPow Impedance: 44.9283
Implantable Pulse Generator Model: 2357
Implantable Pulse Generator Serial Number: 7208008
Lead Channel Impedance Value: 612.5 Ohm
Lead Channel Pacing Threshold Amplitude: 0.75 V
Lead Channel Pacing Threshold Amplitude: 0.75 V
Lead Channel Pacing Threshold Amplitude: 1 V
Lead Channel Pacing Threshold Amplitude: 1 V
Lead Channel Pacing Threshold Pulse Width: 0.5 ms
Lead Channel Pacing Threshold Pulse Width: 0.5 ms
Lead Channel Sensing Intrinsic Amplitude: 12 mV
Lead Channel Sensing Intrinsic Amplitude: 3.6 mV
Lead Channel Setting Pacing Amplitude: 3.5 V
MDC IDC MSMT BATTERY REMAINING LONGEVITY: 104.4 mo
MDC IDC MSMT LEADCHNL RA PACING THRESHOLD PULSEWIDTH: 0.5 ms
MDC IDC MSMT LEADCHNL RA PACING THRESHOLD PULSEWIDTH: 0.5 ms
MDC IDC MSMT LEADCHNL RV IMPEDANCE VALUE: 425 Ohm
MDC IDC SET LEADCHNL RV PACING AMPLITUDE: 3.5 V
MDC IDC SET LEADCHNL RV PACING PULSEWIDTH: 0.5 ms
MDC IDC SET LEADCHNL RV SENSING SENSITIVITY: 0.5 mV
MDC IDC SET ZONE DETECTION INTERVAL: 250 ms
Zone Setting Detection Interval: 280 ms
Zone Setting Detection Interval: 330 ms

## 2013-12-06 NOTE — Progress Notes (Signed)
Wound check appointment. Steri-strips removed. Wound without redness or edema. Incision edges approximated, wound well healed. Normal device function. Thresholds, sensing, and impedances consistent with implant measurements. Device programmed at 3.5V for extra safety margin until 3 month visit. Histogram distribution appropriate for patient and level of activity. No mode switches.  2 VT episodes converted with ATP x1, 5 NSVT episodes.  No further episodes since the increase in Metoprolol to 100mg  daily per Dr. Rayann Heman.   Patient educated about wound care, arm mobility, lifting restrictions, shock plan. ROV in 3 months with implanting physician.

## 2013-12-08 ENCOUNTER — Encounter: Payer: Self-pay | Admitting: Cardiology

## 2013-12-20 ENCOUNTER — Ambulatory Visit (INDEPENDENT_AMBULATORY_CARE_PROVIDER_SITE_OTHER): Payer: 59 | Admitting: Internal Medicine

## 2013-12-20 ENCOUNTER — Encounter: Payer: Self-pay | Admitting: Internal Medicine

## 2013-12-20 VITALS — BP 114/62 | HR 73 | Ht 72.0 in | Wt 240.4 lb

## 2013-12-20 DIAGNOSIS — I472 Ventricular tachycardia, unspecified: Secondary | ICD-10-CM

## 2013-12-20 DIAGNOSIS — I422 Other hypertrophic cardiomyopathy: Secondary | ICD-10-CM

## 2013-12-20 MED ORDER — METOPROLOL SUCCINATE ER 100 MG PO TB24: ORAL_TABLET | ORAL | Status: DC

## 2013-12-20 NOTE — Patient Instructions (Signed)
Keep follow up as scheduled   Your physician has recommended you make the following change in your medication:  1) Increase Metoprolol to 100mg  every am and 50mg  every pm (1/2 of a 100mg  tablet)
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
## 2013-12-22 ENCOUNTER — Telehealth: Payer: Self-pay | Admitting: Medical

## 2013-12-22 ENCOUNTER — Telehealth: Payer: Self-pay | Admitting: Internal Medicine

## 2013-12-22 ENCOUNTER — Encounter: Payer: Self-pay | Admitting: Medical

## 2013-12-22 NOTE — Telephone Encounter (Signed)
New Prob   Pt has some questions regarding instructions if defib were to go off. Please call.

## 2013-12-22 NOTE — Telephone Encounter (Signed)
Called pt and advised that he needs CPE appt per [facility]. Pt will call back next week to schedule the appt

## 2013-12-22 NOTE — Telephone Encounter (Signed)
Reviewed shock protocol with the patient and he voiced understanding.

## 2014-01-01 ENCOUNTER — Telehealth: Payer: Self-pay | Admitting: *Deleted

## 2014-01-01 NOTE — Telephone Encounter (Signed)
Pt having symptoms of "almost a rapid heart rate then returning to normal." Pt concerned he is having VT. I had him send extra remote. Transmission shows no new events (other than the known events previously recorded). Pt continues to take his metoprolol as directed. Follow up as directed.

## 2014-01-08 ENCOUNTER — Encounter: Payer: Self-pay | Admitting: Cardiology

## 2014-01-08 DIAGNOSIS — Z95810 Presence of automatic (implantable) cardiac defibrillator: Secondary | ICD-10-CM | POA: Insufficient documentation

## 2014-01-08 NOTE — Progress Notes (Unsigned)
Patient ID: Nathan [name], male   DOB: 10-10-1960, 53 y.o.   MRN: [id number]   Nathan [name], [employer]    Date of visit:  01/08/2014 DOB:  1960/12/04    Age:  53 yrs. Medical record number:  [id number]     Account number:  [id number] Primary Care Provider: Robyne Askew ____________________________ CURRENT DIAGNOSES  1. Apical hypertrophic cardiomyopathy  2. Abnormal electrocardiogram [ECG] [EKG]  3. Presence of automatic (implantable) cardiac defibrillator  4. Ventricular tachycardia  5. Other obesity ____________________________ ALLERGIES  No Known Drug Allergies ____________________________ MEDICATIONS  1. Crestor 10 mg tablet, 1 p.o. daily  2. metoprolol succinate ER 100 mg tablet,extended release 24 hr, 1 qam  1/2 qpm  3. Tylenol Extra Strength 500 mg tablet, PRN ____________________________ CHIEF COMPLAINTS  Followup of Other hypertrophic cardiomyopathy  Followup of Presence of automatic (implantable) cardiac defibrillator ____________________________ HISTORY OF PRESENT ILLNESS  Patient seen for cardiac followup. Since he was previously here he developed ventricular tachycardia and was admitted to the hospital and had a defibrillator implanted. Since then his beta blockers have been titrated up because he has required recurrent antitachycardia pacing. He is somewhat anxious about his condition and notes occasional episodes of fluttering or a feeling as if something is not right in there but admits that he is quite aware of his heart. Interrogation of his defibrillator shows no episodes or antitachycardia pacemaking since October 8. He is anxious to know about hunting as well as activity. ____________________________ PAST HISTORY  Past Medical Illnesses:  denies hypertension or diabetes;  Cardiovascular Illnesses:  apical hypertrophic cardiomyopathy, ventricular tachycardia;  Surgical Procedures:  no prior surgical procedures;  Cardiology Procedures-Invasive:  cardiac cath (left) December  2013, AICD implant September 2015;  Cardiology Procedures-Noninvasive:  echocardiogram December 2013, cardiac MRI December 2013, event monitor August 2015, echocardiogram September 2015;  Cardiac Cath Results:  normal coronary arteries;  LVEF of 65% documented via cardiac cath on 02/25/2012,   ____________________________ CARDIO-PULMONARY TEST DATES EKG Date:  10/25/2013;   Cardiac Cath Date:  02/25/2012;  Holter/Event Monitor Date: 10/26/2013;  Echocardiography Date: 11/15/2013;  Chest Xray Date: 02/22/2012;  CT Scan Date:  03/03/2012   ____________________________ FAMILY HISTORY Brother -- Brother alive with problem, Hypertension Father -- Father alive with problem, Diabetes mellitus, Coronary Artery Disease Mother -- Mother alive and well ____________________________ SOCIAL HISTORY Alcohol Use:  occasionally;  Smoking:  never smoked;  Diet:  regular diet;  Lifestyle:  married;  Exercise:  no regular exercise;  Occupation:  Clinical cytogeneticist;  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 Respiratory: denies dyspnea, cough, wheezing or hemoptysis. Cardiovascular:  please review HPI  Genitourinary-Male: no dysuria, urgency, frequency, or nocturia  Musculoskeletal:  arthritis of the knees Psychiatric:  insomnia, situational stress  ____________________________ PHYSICAL EXAMINATION VITAL SIGNS  Blood Pressure:  110/60 Sitting, Left arm, regular cuff  , 110/64 Standing, Left arm and regular cuff   Pulse:  56/min. Weight:  238.00 lbs. Height:  73"BMI: 31  Constitutional:  pleasant white male in no acute distress, mildly obese Skin:  warm and dry to touch, no apparent skin lesions, or masses noted. Head:  normocephalic, normal hair pattern, no masses or tenderness Neck:  supple, without massess. No JVD, thyromegaly or carotid bruits. Carotid upstroke normal. Chest:  normal symmetry, clear to  auscultation. Cardiac:  regular rhythm, normal S1 and S2, No S3 or S4, no murmurs, gallops or rubs detected. Extremities &  Back:  no deformities, clubbing, cyanosis, erythema or edema observed. Normal muscle strength and tone. ___________________________ IMPRESSIONS/PLAN  1. Apical hypertrophic cardiomyopathy 2. Ventricular tachycardia secondary to one 3. Obesity  Recommendations:  He has had his beta blockers increased by the electrophysiologist. I would like him to have an exercise treadmill test to help be sure he does not have exercise-induced arrhythmias. Followup afterwards. ____________________________ Cleda Clarks  1. treadmill:  Regular TM At [facility] [employer]                       ____________________________ Cardiology Physician:  Kerry Hough MD [facility]

## 2014-01-22 ENCOUNTER — Encounter: Payer: Self-pay | Admitting: Internal Medicine

## 2014-01-31 ENCOUNTER — Telehealth: Payer: Self-pay | Admitting: *Deleted

## 2014-01-31 NOTE — Telephone Encounter (Signed)
Pt noticed heart racing symptoms last night between 8:30-10:30pm. ICD remote shows several NSVT recordings. No ATP necessary. Longest NSVT episode was approx. 6sec (15 beats duration).  ROV w/ Dr. Rayann Heman 03/05/14.

## 2014-02-03 ENCOUNTER — Encounter: Payer: Self-pay | Admitting: Internal Medicine

## 2014-02-05 ENCOUNTER — Telehealth: Payer: Self-pay | Admitting: *Deleted

## 2014-02-05 NOTE — Telephone Encounter (Signed)
Informed pt that he had treated VT-1 episodes on 11-20 @ 10:10 and 10:13---both treated with ATP. Patient states that he felt a "fast HR" and lightheadedness during the time of the episode. Patient aware of driving restrictions. Will inform Dr.Allred of episodes and if further changes are needed will inform pt. Pt voiced understanding.

## 2014-02-07 ENCOUNTER — Telehealth: Payer: Self-pay | Admitting: *Deleted

## 2014-02-07 NOTE — Telephone Encounter (Signed)
LM informing pt that Dr.Allred saw 11-21 remote and does not want to make any changes. Encouraged pt to call back with questions.

## 2014-02-13 ENCOUNTER — Encounter: Payer: Self-pay | Admitting: Internal Medicine

## 2014-02-22 ENCOUNTER — Encounter (HOSPITAL_COMMUNITY): Payer: Self-pay | Admitting: Internal Medicine

## 2014-02-24 ENCOUNTER — Encounter: Payer: Self-pay | Admitting: Internal Medicine

## 2014-02-26 ENCOUNTER — Encounter: Payer: Self-pay | Admitting: *Deleted

## 2014-02-26 ENCOUNTER — Telehealth: Payer: Self-pay | Admitting: *Deleted

## 2014-02-26 NOTE — Telephone Encounter (Signed)
Pt received ATP on 02/23/14 @ 3:07am & 4:56am. Pt was sleeping. Pt does recall NSVT on 02/23/14 @10 :07am--- felt irregular heart beats.  Pt calls his irregular episodes his new normal.   Pt aware no [place].  ROV w/ Dr. Rayann Heman 03/05/14 @8 :45am.

## 2014-03-05 ENCOUNTER — Encounter: Payer: Self-pay | Admitting: Internal Medicine

## 2014-03-05 ENCOUNTER — Ambulatory Visit (INDEPENDENT_AMBULATORY_CARE_PROVIDER_SITE_OTHER): Payer: 59 | Admitting: Internal Medicine

## 2014-03-05 ENCOUNTER — Telehealth: Payer: Self-pay | Admitting: Internal Medicine

## 2014-03-05 VITALS — BP 100/70 | HR 50 | Ht 72.0 in | Wt 242.8 lb

## 2014-03-05 DIAGNOSIS — I472 Ventricular tachycardia, unspecified: Secondary | ICD-10-CM

## 2014-03-05 DIAGNOSIS — I422 Other hypertrophic cardiomyopathy: Secondary | ICD-10-CM

## 2014-03-05 LAB — MDC_IDC_ENUM_SESS_TYPE_INCLINIC
Brady Statistic RV Percent Paced: 0.1 %
Date Time Interrogation Session: 20151221092447
HighPow Impedance: 50.8972
HighPow Impedance: 51 Ohm
Lead Channel Impedance Value: 425 Ohm
Lead Channel Pacing Threshold Amplitude: 0.5 V
Lead Channel Pacing Threshold Amplitude: 0.75 V
Lead Channel Pacing Threshold Pulse Width: 0.5 ms
Lead Channel Sensing Intrinsic Amplitude: 12 mV
Lead Channel Sensing Intrinsic Amplitude: 4.2 mV
Lead Channel Setting Pacing Amplitude: 2 V
Lead Channel Setting Sensing Sensitivity: 0.5 mV
MDC IDC MSMT BATTERY REMAINING LONGEVITY: 104.4 mo
MDC IDC MSMT LEADCHNL RA IMPEDANCE VALUE: 550 Ohm
MDC IDC MSMT LEADCHNL RA PACING THRESHOLD PULSEWIDTH: 0.5 ms
MDC IDC PG SERIAL: 7208008
MDC IDC SET LEADCHNL RV PACING AMPLITUDE: 2.5 V
MDC IDC SET LEADCHNL RV PACING PULSEWIDTH: 0.5 ms
MDC IDC SET ZONE DETECTION INTERVAL: 250 ms
MDC IDC SET ZONE DETECTION INTERVAL: 330 ms
MDC IDC STAT BRADY RA PERCENT PACED: 22 %
Zone Setting Detection Interval: 280 ms
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
## 2014-03-05 NOTE — Telephone Encounter (Signed)
New problem   Pt has question about his hospital stay concerning his visit for his medication change. Please call pt.

## 2014-03-05 NOTE — Patient Instructions (Addendum)
Your physician wants you to follow-up in: 3 months with Dr. Vallery Ridge will receive a reminder letter in the mail two months in advance. If you don't receive a letter, please call our office to schedule the follow-up appointment.  Remote monitoring is used to monitor your Pacemaker or ICD from home. This monitoring reduces the number of office visits required to check your device to one time per year. It allows [name] to keep an eye on the functioning of your device to ensure it is working properly. You are scheduled for a device check from home on 06/04/14. You may send your transmission at any time that day. If you have a wireless device, the transmission will be sent automatically. After your physician reviews your transmission, you will receive a postcard with your next transmission date.  Your physician has recommended you make the following change in your medication:  1) Call if you want to proceed with 48 hour hospital admission for Norpace  Your physician recommends that you schedule a follow-up appointment in: 6 weeks if patient starts Norpace

## 2014-03-05 NOTE — Telephone Encounter (Signed)
Dr Wynonia Lawman is going to handle the [facility]

## 2014-03-12 ENCOUNTER — Inpatient Hospital Stay (HOSPITAL_COMMUNITY)
Admission: AD | Admit: 2014-03-12 | Discharge: 2014-03-14 | DRG: 309 | Disposition: A | Discharge status: Home / Self Care | Payer: 59 | Source: Ambulatory Visit | Attending: Cardiology | Admitting: Cardiology

## 2014-03-12 ENCOUNTER — Encounter (HOSPITAL_COMMUNITY): Payer: Self-pay | Admitting: General Practice

## 2014-03-12 DIAGNOSIS — M1990 Unspecified osteoarthritis, unspecified site: Secondary | ICD-10-CM | POA: Diagnosis present

## 2014-03-12 DIAGNOSIS — I472 Ventricular tachycardia, unspecified: Secondary | ICD-10-CM

## 2014-03-12 DIAGNOSIS — F419 Anxiety disorder, unspecified: Secondary | ICD-10-CM | POA: Diagnosis present

## 2014-03-12 DIAGNOSIS — I422 Other hypertrophic cardiomyopathy: Secondary | ICD-10-CM | POA: Diagnosis present

## 2014-03-12 DIAGNOSIS — Z79899 Other long term (current) drug therapy: Secondary | ICD-10-CM

## 2014-03-12 DIAGNOSIS — R000 Tachycardia, unspecified: Secondary | ICD-10-CM | POA: Diagnosis present

## 2014-03-12 DIAGNOSIS — Z95810 Presence of automatic (implantable) cardiac defibrillator: Secondary | ICD-10-CM | POA: Diagnosis present

## 2014-03-12 DIAGNOSIS — F329 Major depressive disorder, single episode, unspecified: Secondary | ICD-10-CM | POA: Diagnosis present

## 2014-03-12 DIAGNOSIS — E785 Hyperlipidemia, unspecified: Secondary | ICD-10-CM | POA: Diagnosis not present

## 2014-03-12 DIAGNOSIS — E669 Obesity, unspecified: Secondary | ICD-10-CM | POA: Diagnosis present

## 2014-03-12 DIAGNOSIS — Z85828 Personal history of other malignant neoplasm of skin: Secondary | ICD-10-CM | POA: Diagnosis not present

## 2014-03-12 LAB — COMPREHENSIVE METABOLIC PANEL
ALBUMIN: 3.8 g/dL (ref 3.5–5.2)
ALK PHOS: 48 U/L (ref 39–117)
ALT: 28 U/L (ref 0–53)
AST: 24 U/L (ref 0–37)
Anion gap: 7 (ref 5–15)
BUN: 10 mg/dL (ref 6–23)
CALCIUM: 9.2 mg/dL (ref 8.4–10.5)
CO2: 28 mmol/L (ref 19–32)
Chloride: 105 mEq/L (ref 96–112)
Creatinine, Ser: 1.07 mg/dL (ref 0.50–1.35)
GFR calc non Af Amer: 76 mL/min — ABNORMAL LOW (ref >90)
GFR, EST AFRICAN AMERICAN: 88 mL/min — AB (ref >90)
Glucose, Bld: 132 mg/dL — ABNORMAL HIGH (ref 70–99)
Potassium: 3.7 mmol/L (ref 3.5–5.1)
Sodium: 140 mmol/L (ref 135–145)
TOTAL PROTEIN: 6.9 g/dL (ref 6.0–8.3)
Total Bilirubin: 0.8 mg/dL (ref 0.3–1.2)

## 2014-03-12 LAB — GLUCOSE, CAPILLARY: Glucose-Capillary: 132 mg/dL — ABNORMAL HIGH (ref 70–99)

## 2014-03-12 LAB — TSH: TSH: 3.658 u[IU]/mL (ref 0.350–4.500)

## 2014-03-12 MED ORDER — DISOPYRAMIDE PHOSPHATE ER 100 MG PO CP12
100.0000 mg | ORAL_CAPSULE | Freq: Two times a day (BID) | ORAL | Status: DC
  Administered 2014-03-12 – 2014-03-14 (×4): 100 mg via ORAL
  Filled 2014-03-12 (×9): qty 1

## 2014-03-12 MED ORDER — METOPROLOL SUCCINATE ER 100 MG PO TB24
100.0000 mg | ORAL_TABLET | Freq: Every day | ORAL | Status: DC
  Administered 2014-03-13 – 2014-03-14 (×2): 100 mg via ORAL
  Filled 2014-03-12 (×3): qty 1

## 2014-03-12 MED ORDER — METOPROLOL SUCCINATE ER 50 MG PO TB24
50.0000 mg | ORAL_TABLET | Freq: Every day | ORAL | Status: DC
Start: 2014-03-12 — End: 2014-03-14
  Administered 2014-03-13: 50 mg via ORAL
  Filled 2014-03-12 (×3): qty 1

## 2014-03-12 MED ORDER — ROSUVASTATIN CALCIUM 10 MG PO TABS
10.0000 mg | ORAL_TABLET | Freq: Every day | ORAL | Status: DC
  Administered 2014-03-13 – 2014-03-14 (×2): 10 mg via ORAL
  Filled 2014-03-12 (×2): qty 1
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
## 2014-03-13 MED ORDER — ACETAMINOPHEN 325 MG PO TABS
325.0000 mg | ORAL_TABLET | Freq: Four times a day (QID) | ORAL | Status: DC | PRN
  Administered 2014-03-13: 325 mg via ORAL
  Filled 2014-03-13: qty 1

## 2014-03-13 NOTE — Progress Notes (Signed)
Subjective:  Feels fine except for mild complaint of headache.  No ventricular tachycardia overnight  Objective:  Vital Signs in the last 24 hours: BP 119/62 mmHg  Pulse 50  Temp(Src) 98.3 F (36.8 C) (Oral)  Resp 18  SpO2 98%  Physical Exam: Pleasant male in no acute distress   Lab Results: Basic Metabolic Panel:  Recent Labs  03/12/14 1732  NA 140  K 3.7  CL 105  CO2 28  GLUCOSE 132*  BUN 10  CREATININE 1.07   BNP    Component Value Date/Time   PROBNP 432.3* 11/22/2013 1911   Telemetry: Sinus rhythm, no ventricular tachycardia overnight  Assessment/Plan:  1.  History of nonsustained ventricular tachycardia. 2.  Apical hypertrophic cardiomyopathy. 3.  Tolerating Norpace well so far.  Recommendations:  Continue Norpace load      W. Doristine Church  MD [facility]  03/13/2014, 8:50 AM

## 2014-03-14 MED ORDER — DISOPYRAMIDE PHOSPHATE ER 100 MG PO CP12: 100.0000 mg | ORAL_CAPSULE | Freq: Two times a day (BID) | ORAL | Status: DC

## 2014-03-14 NOTE — Discharge Summary (Addendum)
Physician Discharge Summary  Patient ID: Nathan [name] MRN: [id number] DOB/AGE: 11/20/1960 53 y.o.  Admit date: 03/12/2014 Discharge date: 03/14/2014  Primary Physician:  Dr. Jill Alexanders   Primary Discharge Diagnosis:   1.  Paroxysmal ventricular tachycardia  Secondary Discharge Diagnosis: 2.  Apical hypertrophic cardiomyopathy. 3.  Obesity. 4.  Hyperlipidemia under treatment 5.  Functioning implantable defibrillator  Hospital Course: Patient was brought into the hospital to initiate Norpace under monitoring.  He received a total of 3 doses of the sustained release formula.  The morning of discharge, the pharmacy did not have any available to administer to him so he was given a dose of the regular formula since he had not had any arrhythmias and was able to go home.  He had no arrhythmias.  He had no QT prolongation and tolerated the medication fine.  He is to follow-up with Dr. Rayann Heman in the next month to evaluate whether this is had any effect on the ventricular tachycardia that he was having.  Discharge Exam: Blood pressure 114/64, pulse 50, temperature 98.4 F (36.9 C), temperature source Oral, resp. rate 20, SpO2 96 %.    Labs: CBC:   Lab Results  Component Value Date   WBC 8.9 11/23/2013   HGB 15.0 11/23/2013   HCT 43.1 11/23/2013   MCV 88.7 11/23/2013   PLT 190 11/23/2013    CMP:  Recent Labs Lab 03/12/14 1732  NA 140  K 3.7  CL 105  CO2 28  BUN 10  CREATININE 1.07  CALCIUM 9.2  PROT 6.9  BILITOT 0.8  ALKPHOS 48  ALT 28  AST 24  GLUCOSE 132*   Lipid Panel     Component Value Date/Time   CHOL 174 02/22/2012 1050   TRIG 187* 02/22/2012 1050   HDL 32* 02/22/2012 1050   CHOLHDL 5.4 02/22/2012 1050   VLDL 37 02/22/2012 1050   LDLCALC 105* 02/22/2012 1050   BNP (last 3 results)  Recent Labs  11/22/13 1911  PROBNP 432.3*    Protime: Invalid input(s): PT  Thyroid: Lab Results  Component Value Date   TSH 3.658 03/12/2014   EKG: Sinus  bradycardia, diffuse T-wave abnormalities in anterior leads, early transition  Discharge Medications:   Medication List    TAKE these medications        CRESTOR 10 MG tablet  Generic drug:  rosuvastatin  Take 10 mg by mouth daily.     disopyramide 100 MG 12 hr capsule  Commonly known as:  NORPACE CR  Take 1 capsule (100 mg total) by mouth every 12 (twelve) hours.     metoprolol succinate 100 MG 24 hr tablet  Commonly known as:  TOPROL-XL  Take with or immediately following a meal. Take 100mg  every morning and 50mg  every pm       Followup plans and appointments: He is to call Dr. Rayann Heman and make an appointment within the next month to evaluate the effect of the Norpace.  Time spent with patient to include physician time:  30 minutes  Signed: W. Doristine Church. MD [facility] 03/14/2014, 8:56 AM

## 2014-03-14 NOTE — Progress Notes (Signed)
Pt A&O x4; pt discharge education and instructions completed with pt and spouse at bedside. Both voices understanding and denies any questions. Pt IV and telemetry removed; pt handed his prescription for Norpace by MD. Pt discharge home with spouse to transport him home. Pt ambulated off unit with spouse and belongings to the side. Francis Gaines Cleave Ternes RN.

## 2014-06-04 ENCOUNTER — Ambulatory Visit (INDEPENDENT_AMBULATORY_CARE_PROVIDER_SITE_OTHER): Payer: 59 | Admitting: *Deleted

## 2014-06-04 DIAGNOSIS — I472 Ventricular tachycardia, unspecified: Secondary | ICD-10-CM

## 2014-06-04 NOTE — Progress Notes (Signed)
Remote ICD transmission.   

## 2014-06-07 LAB — MDC_IDC_ENUM_SESS_TYPE_REMOTE
Battery Remaining Percentage: 92 %
Battery Voltage: 3.19 V
Brady Statistic AP VP Percent: 1 %
Brady Statistic AS VP Percent: 1 %
Date Time Interrogation Session: 20160321095219
HighPow Impedance: 49 Ohm
HighPow Impedance: 49 Ohm
Implantable Pulse Generator Serial Number: 7208008
Lead Channel Impedance Value: 400 Ohm
Lead Channel Impedance Value: 530 Ohm
Lead Channel Pacing Threshold Amplitude: 0.5 V
Lead Channel Pacing Threshold Pulse Width: 0.5 ms
Lead Channel Sensing Intrinsic Amplitude: 3.2 mV
Lead Channel Setting Pacing Amplitude: 2 V
Lead Channel Setting Sensing Sensitivity: 0.5 mV
MDC IDC MSMT BATTERY REMAINING LONGEVITY: 95 mo
MDC IDC MSMT LEADCHNL RA PACING THRESHOLD AMPLITUDE: 0.75 V
MDC IDC MSMT LEADCHNL RV PACING THRESHOLD PULSEWIDTH: 0.5 ms
MDC IDC MSMT LEADCHNL RV SENSING INTR AMPL: 12 mV
MDC IDC SET LEADCHNL RV PACING AMPLITUDE: 2.5 V
MDC IDC SET LEADCHNL RV PACING PULSEWIDTH: 0.5 ms
MDC IDC SET ZONE DETECTION INTERVAL: 250 ms
MDC IDC STAT BRADY AP VS PERCENT: 25 %
MDC IDC STAT BRADY AS VS PERCENT: 74 %
MDC IDC STAT BRADY RA PERCENT PACED: 23 %
MDC IDC STAT BRADY RV PERCENT PACED: 1 %
Zone Setting Detection Interval: 280 ms
Zone Setting Detection Interval: 330 ms

## 2014-06-21 ENCOUNTER — Encounter: Payer: Self-pay | Admitting: Cardiology

## 2014-06-25 ENCOUNTER — Encounter: Payer: Self-pay | Admitting: Internal Medicine

## 2014-07-17 ENCOUNTER — Encounter: Payer: Self-pay | Admitting: Cardiology

## 2014-07-17 DIAGNOSIS — I472 Ventricular tachycardia, unspecified: Secondary | ICD-10-CM

## 2014-07-17 NOTE — Progress Notes (Signed)
Patient ID: Nathan [name], male   DOB: [birth year], 54 y.o.   MRN: [id number]   Nathan [name], Nathan [name]    Date of visit:  07/17/2014 DOB:  [birth year]    Age:  54 yrs. Medical record number:  [id number]     Account number:  [id number] Primary Care Provider: Robyne Askew ____________________________ CURRENT DIAGNOSES  1. Apical hypertrophic cardiomyopathy  2. Abnormal electrocardiogram [ECG] [EKG]  3. Presence of automatic (implantable) cardiac defibrillator  4. Ventricular tachycardia  5. Obesity  6. Dyspnea, unspecified ____________________________ ALLERGIES  No Known Drug Allergies ____________________________ MEDICATIONS  1. metoprolol succinate ER 100 mg tablet,extended release 24 hr, 1 qam  1/2 qpm  2. Tylenol Extra Strength 500 mg tablet, PRN  3. Norpace CR 100 mg capsule,extended release, BID  4. Crestor 10 mg tablet, 1 p.o. daily ____________________________ CHIEF COMPLAINTS  Followup of Other hypertrophic cardiomyopathy ____________________________ HISTORY OF PRESENT ILLNESS Patient seen for cardiac followup. He was admitted to the hospital earlier this year and was started on treatment with Norpace which he tolerated well. He has felt better since then and has had no defibrillator discharges. We have not been following his defibrillator so it is hard to tell whether this has successfully suppressed his ventricular tachycardia or not. He is feeling better than he did 2 years ago but still has dyspnea when he walks up hills. He doesn't have angina but has occasional sharp chest pain that lasts a second or so. He denies PND, orthopnea, syncope, or claudication. ____________________________ PAST HISTORY  Past Medical Illnesses:  denies hypertension or diabetes;  Cardiovascular Illnesses:  apical hypertrophic cardiomyopathy, ventricular tachycardia;  Surgical Procedures:  no prior surgical procedures;  NYHA Classification:  I;  Canadian Angina Classification:  Class 0: Asymptomatic;   Cardiology Procedures-Invasive:  cardiac cath (left) December 2013, AICD implant September 2015;  Cardiology Procedures-Noninvasive:  echocardiogram December 2013, cardiac MRI December 2013, event monitor August 2015, echocardiogram September 2015;  Cardiac Cath Results:  normal coronary arteries;  LVEF of 65% documented via cardiac cath on 02/25/2012,   ____________________________ CARDIO-PULMONARY TEST DATES EKG Date:  07/17/2014;   Cardiac Cath Date:  02/25/2012;  Holter/Event Monitor Date: 10/26/2013;  Echocardiography Date: 11/15/2013;  Chest Xray Date: 02/22/2012;  CT Scan Date:  03/03/2012   ____________________________ FAMILY HISTORY Brother -- Brother alive with problem, Hypertension Father -- Father alive with problem, Diabetes mellitus, Coronary Artery Disease Mother -- Mother alive and well ____________________________ SOCIAL HISTORY Alcohol Use:  occasionally;  Smoking:  never smoked;  Diet:  regular diet;  Lifestyle:  married;  Exercise:  no regular exercise;  Occupation:  Clinical cytogeneticist;  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 Respiratory: denies dyspnea, cough, wheezing or hemoptysis. Cardiovascular:  please review HPI  Genitourinary-Male: no dysuria, urgency, frequency, or nocturia  Musculoskeletal:  arthritis of the knees Psychiatric:  insomnia, situational stress  ____________________________ PHYSICAL EXAMINATION VITAL SIGNS  Blood Pressure:  110/60 Sitting, Left arm, regular cuff  , 110/64 Standing, Left arm and regular cuff   Pulse:  54/min. Weight:  244.00 lbs. Height:  73"BMI: 32  Constitutional:  pleasant white male in no acute distress, mildly obese Skin:  warm and dry to touch, no apparent skin lesions, or masses noted. Head:  normocephalic, normal hair pattern, no masses or tenderness Neck:  supple, without massess. No JVD, thyromegaly or carotid bruits.  Carotid upstroke normal. Chest:  normal symmetry, clear to auscultation. Cardiac:  regular rhythm, normal S1 and S2, No S3 or S4, no murmurs, gallops or rubs detected. Extremities & Back:  no deformities, clubbing, cyanosis, erythema or edema observed. Normal muscle strength and tone. ___________________________ IMPRESSIONS/PLAN  1. Apical hypertrophic cardiomyopathy 2. Ventricular tachycardia 3. Long-term use of Norpace 4. Obesity with need to lose weight  Recommendations:  Tolerating Norpace well. EKG shows marked T-wave abnormalities but no severe QT prolongation. Defibrillator is monitored through the EP doctors. Answered a lot of questions about hypertrophic cardiomyopathy. Followup in 6 months. ____________________________ TODAYS ORDERS  1. 12 Lead EKG: Today  2. Return Visit: 6 months  3. 12 Lead EKG: 6 months                       ____________________________ Cardiology Physician:  Kerry Hough MD [facility]

## 2014-08-27 ENCOUNTER — Encounter: Payer: Self-pay | Admitting: Medical

## 2014-08-27 ENCOUNTER — Ambulatory Visit (INDEPENDENT_AMBULATORY_CARE_PROVIDER_SITE_OTHER): Payer: 59 | Admitting: Medical

## 2014-08-27 ENCOUNTER — Telehealth: Payer: Self-pay | Admitting: Medical

## 2014-08-27 VITALS — BP 92/50 | HR 54 | Temp 97.9°F | Resp 14 | Ht 72.0 in | Wt 247.0 lb

## 2014-08-27 DIAGNOSIS — Z1211 Encounter for screening for malignant neoplasm of colon: Secondary | ICD-10-CM | POA: Diagnosis not present

## 2014-08-27 DIAGNOSIS — I422 Other hypertrophic cardiomyopathy: Secondary | ICD-10-CM

## 2014-08-27 DIAGNOSIS — Z23 Encounter for immunization: Secondary | ICD-10-CM | POA: Diagnosis not present

## 2014-08-27 DIAGNOSIS — I472 Ventricular tachycardia, unspecified: Secondary | ICD-10-CM

## 2014-08-27 DIAGNOSIS — Z95810 Presence of automatic (implantable) cardiac defibrillator: Secondary | ICD-10-CM

## 2014-08-27 DIAGNOSIS — Z0000 Encounter for general adult medical examination without abnormal findings: Secondary | ICD-10-CM | POA: Diagnosis not present

## 2014-08-27 DIAGNOSIS — E785 Hyperlipidemia, unspecified: Secondary | ICD-10-CM | POA: Diagnosis not present

## 2014-08-27 DIAGNOSIS — Z125 Encounter for screening for malignant neoplasm of prostate: Secondary | ICD-10-CM

## 2014-08-27 DIAGNOSIS — E669 Obesity, unspecified: Secondary | ICD-10-CM

## 2014-08-27 LAB — POCT URINALYSIS DIPSTICK
Bilirubin, UA: NEGATIVE
Blood, UA: NEGATIVE
Glucose, UA: NEGATIVE
Ketones, UA: NEGATIVE
LEUKOCYTES UA: NEGATIVE
NITRITE UA: NEGATIVE
Spec Grav, UA: 1.02
Urobilinogen, UA: NEGATIVE
pH, UA: 7

## 2014-08-27 NOTE — Telephone Encounter (Signed)
Refer for first screening colonoscopy.  Please also call Dr. Thurman Coyer office and make sure they are ok with him going for sedation for colonoscopy, any specific recommendations?
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
## 2014-08-28 NOTE — Telephone Encounter (Signed)
I called over to Dr. Thurman Coyer and had to leave a message . Dr. Wynonia Lawman is out of the office this week.

## 2014-08-28 NOTE — Telephone Encounter (Signed)
I put orders in EPIC for his colonoscopy and [facility] will contact the patient for his appointment

## 2014-08-30 ENCOUNTER — Other Ambulatory Visit: Payer: 59

## 2014-08-30 DIAGNOSIS — E669 Obesity, unspecified: Secondary | ICD-10-CM

## 2014-08-30 DIAGNOSIS — E785 Hyperlipidemia, unspecified: Secondary | ICD-10-CM

## 2014-08-30 DIAGNOSIS — Z0000 Encounter for general adult medical examination without abnormal findings: Secondary | ICD-10-CM

## 2014-08-30 DIAGNOSIS — Z125 Encounter for screening for malignant neoplasm of prostate: Secondary | ICD-10-CM

## 2014-08-30 LAB — COMPREHENSIVE METABOLIC PANEL
ALK PHOS: 47 U/L (ref 39–117)
ALT: 28 U/L (ref 0–53)
AST: 20 U/L (ref 0–37)
Albumin: 4 g/dL (ref 3.5–5.2)
BILIRUBIN TOTAL: 0.8 mg/dL (ref 0.2–1.2)
BUN: 17 mg/dL (ref 6–23)
CO2: 23 meq/L (ref 19–32)
Calcium: 9 mg/dL (ref 8.4–10.5)
Chloride: 104 mEq/L (ref 96–112)
Creat: 1.14 mg/dL (ref 0.50–1.35)
GLUCOSE: 102 mg/dL — AB (ref 70–99)
Potassium: 4.3 mEq/L (ref 3.5–5.3)
Sodium: 138 mEq/L (ref 135–145)
Total Protein: 6.8 g/dL (ref 6.0–8.3)

## 2014-08-30 LAB — CBC
HCT: 44.3 % (ref 39.0–52.0)
HEMOGLOBIN: 15.5 g/dL (ref 13.0–17.0)
MCH: 31.4 pg (ref 26.0–34.0)
MCHC: 35 g/dL (ref 30.0–36.0)
MCV: 89.9 fL (ref 78.0–100.0)
MPV: 10.9 fL (ref 8.6–12.4)
PLATELETS: 182 10*3/uL (ref 150–400)
RBC: 4.93 MIL/uL (ref 4.22–5.81)
RDW: 13.7 % (ref 11.5–15.5)
WBC: 7.7 10*3/uL (ref 4.0–10.5)

## 2014-08-30 LAB — HEMOGLOBIN A1C
Hgb A1c MFr Bld: 5.6 % (ref <5.7)
Mean Plasma Glucose: 114 mg/dL (ref <117)

## 2014-08-30 LAB — TSH: TSH: 3.189 u[IU]/mL (ref 0.350–4.500)

## 2014-08-30 LAB — LIPID PANEL
CHOL/HDL RATIO: 4 ratio
CHOLESTEROL: 101 mg/dL (ref 0–200)
HDL: 25 mg/dL — ABNORMAL LOW (ref >=40)
LDL CALC: 47 mg/dL (ref 0–99)
Triglycerides: 146 mg/dL (ref <150)
VLDL: 29 mg/dL (ref 0–40)

## 2014-08-30 NOTE — Telephone Encounter (Signed)
F/u with them on monday

## 2014-08-31 LAB — PSA: PSA: 0.54 ng/mL (ref <=4.00)

## 2014-09-05 ENCOUNTER — Ambulatory Visit (INDEPENDENT_AMBULATORY_CARE_PROVIDER_SITE_OTHER): Payer: 59 | Admitting: *Deleted

## 2014-09-05 ENCOUNTER — Encounter: Payer: Self-pay | Admitting: Internal Medicine

## 2014-09-05 DIAGNOSIS — I472 Ventricular tachycardia, unspecified: Secondary | ICD-10-CM

## 2014-09-05 NOTE — Progress Notes (Signed)
Remote ICD transmission.   

## 2014-09-09 LAB — CUP PACEART REMOTE DEVICE CHECK
Battery Remaining Longevity: 92 mo
Battery Remaining Percentage: 90 %
Battery Voltage: 3.14 V
Brady Statistic AP VP Percent: 1 %
Brady Statistic AP VS Percent: 23 %
Brady Statistic RA Percent Paced: 22 %
Date Time Interrogation Session: 20160622062300
HIGH POWER IMPEDANCE MEASURED VALUE: 48 Ohm
HighPow Impedance: 48 Ohm
Lead Channel Impedance Value: 410 Ohm
Lead Channel Pacing Threshold Amplitude: 0.75 V
Lead Channel Pacing Threshold Pulse Width: 0.5 ms
Lead Channel Pacing Threshold Pulse Width: 0.5 ms
Lead Channel Setting Pacing Amplitude: 2 V
MDC IDC MSMT LEADCHNL RA IMPEDANCE VALUE: 560 Ohm
MDC IDC MSMT LEADCHNL RA SENSING INTR AMPL: 3.7 mV
MDC IDC MSMT LEADCHNL RV PACING THRESHOLD AMPLITUDE: 0.5 V
MDC IDC MSMT LEADCHNL RV SENSING INTR AMPL: 12 mV
MDC IDC PG SERIAL: 7208008
MDC IDC SET LEADCHNL RV PACING AMPLITUDE: 2.5 V
MDC IDC SET LEADCHNL RV PACING PULSEWIDTH: 0.5 ms
MDC IDC SET LEADCHNL RV SENSING SENSITIVITY: 0.5 mV
MDC IDC SET ZONE DETECTION INTERVAL: 250 ms
MDC IDC STAT BRADY AS VP PERCENT: 1 %
MDC IDC STAT BRADY AS VS PERCENT: 76 %
MDC IDC STAT BRADY RV PERCENT PACED: 1 %
Zone Setting Detection Interval: 280 ms
Zone Setting Detection Interval: 330 ms

## 2014-09-12 ENCOUNTER — Encounter: Payer: Self-pay | Admitting: Cardiology

## 2014-09-19 NOTE — Telephone Encounter (Signed)
I left a message for the nurse Juliann Pulse at Dr. Ezekiel Slocumb office

## 2014-09-19 NOTE — Telephone Encounter (Signed)
Status update on cardiology?

## 2014-09-26 ENCOUNTER — Encounter: Payer: Self-pay | Admitting: Internal Medicine

## 2014-10-01 ENCOUNTER — Telehealth: Payer: Self-pay | Admitting: Medical

## 2014-10-01 NOTE — Telephone Encounter (Signed)
We did get in touch with Dr. Wynonia Lawman.  He said it is ok for him to have sedation for colonoscopy and move forward with this, but they need to be aware that he has an AICD implantable cardiac defibrillator.

## 2014-12-06 ENCOUNTER — Ambulatory Visit (INDEPENDENT_AMBULATORY_CARE_PROVIDER_SITE_OTHER): Payer: 59 | Admitting: *Deleted

## 2014-12-06 DIAGNOSIS — I472 Ventricular tachycardia, unspecified: Secondary | ICD-10-CM

## 2014-12-06 NOTE — Progress Notes (Signed)
Remote ICD transmission.   

## 2014-12-09 ENCOUNTER — Other Ambulatory Visit: Payer: Self-pay | Admitting: Internal Medicine

## 2014-12-10 LAB — CUP PACEART REMOTE DEVICE CHECK
Battery Remaining Longevity: 90 mo
Brady Statistic AP VS Percent: 24 %
Brady Statistic AS VP Percent: 1 %
Brady Statistic RA Percent Paced: 22 %
Brady Statistic RV Percent Paced: 1 %
Date Time Interrogation Session: 20160922060017
HIGH POWER IMPEDANCE MEASURED VALUE: 50 Ohm
HighPow Impedance: 50 Ohm
Lead Channel Impedance Value: 560 Ohm
Lead Channel Pacing Threshold Amplitude: 0.5 V
Lead Channel Pacing Threshold Pulse Width: 0.5 ms
Lead Channel Pacing Threshold Pulse Width: 0.5 ms
Lead Channel Sensing Intrinsic Amplitude: 3.7 mV
Lead Channel Setting Pacing Amplitude: 2 V
Lead Channel Setting Pacing Amplitude: 2.5 V
Lead Channel Setting Pacing Pulse Width: 0.5 ms
MDC IDC MSMT BATTERY REMAINING PERCENTAGE: 88 %
MDC IDC MSMT BATTERY VOLTAGE: 3.1 V
MDC IDC MSMT LEADCHNL RA PACING THRESHOLD AMPLITUDE: 0.75 V
MDC IDC MSMT LEADCHNL RV IMPEDANCE VALUE: 400 Ohm
MDC IDC MSMT LEADCHNL RV SENSING INTR AMPL: 12 mV
MDC IDC SET LEADCHNL RV SENSING SENSITIVITY: 0.5 mV
MDC IDC SET ZONE DETECTION INTERVAL: 280 ms
MDC IDC STAT BRADY AP VP PERCENT: 1 %
MDC IDC STAT BRADY AS VS PERCENT: 75 %
Pulse Gen Serial Number: 7208008
Zone Setting Detection Interval: 250 ms
Zone Setting Detection Interval: 330 ms

## 2014-12-10 NOTE — Telephone Encounter (Signed)
Thompson Grayer, MD at 03/05/2014 9:17 AM  metoprolol succinate (TOPROL-XL) 100 MG 24 hr tabletTake with or immediately following a meal. Take 100mg  every morning and 50mg  every pm (Patient taking differently: Take with or immediately following a meal. Take 100mg  by mouth every morning and 50mg  by mouth every pm) 2. HCM Continue metoprolol Consider admit for 48 hours for norpace as above Jacolyn Reedy, MD at 07/17/2014 5:43 PM  MEDICATIONS 1. metoprolol succinate ER 100 mg tablet,extended release 24 hr, 1 qam 1/2 qpm

## 2014-12-14 ENCOUNTER — Encounter: Payer: Self-pay | Admitting: Cardiology

## 2014-12-26 ENCOUNTER — Encounter: Payer: Self-pay | Admitting: Internal Medicine

## 2015-01-14 ENCOUNTER — Other Ambulatory Visit: Payer: Self-pay | Admitting: Internal Medicine

## 2015-03-12 ENCOUNTER — Ambulatory Visit (INDEPENDENT_AMBULATORY_CARE_PROVIDER_SITE_OTHER): Payer: 59 | Admitting: *Deleted

## 2015-03-12 DIAGNOSIS — I472 Ventricular tachycardia, unspecified: Secondary | ICD-10-CM

## 2015-03-13 NOTE — Progress Notes (Signed)
Remote ICD transmission.   

## 2015-03-15 ENCOUNTER — Encounter: Payer: Self-pay | Admitting: *Deleted

## 2015-03-29 LAB — CUP PACEART REMOTE DEVICE CHECK
Battery Remaining Longevity: 88 mo
Battery Remaining Percentage: 86 %
Battery Voltage: 3.04 V
Brady Statistic AP VS Percent: 24 %
Brady Statistic AS VP Percent: 1 %
Brady Statistic AS VS Percent: 75 %
Brady Statistic RA Percent Paced: 23 %
HighPow Impedance: 47 Ohm
HighPow Impedance: 47 Ohm
Implantable Lead Implant Date: 20150910
Implantable Lead Location: 753859
Lead Channel Impedance Value: 390 Ohm
Lead Channel Sensing Intrinsic Amplitude: 3.4 mV
Lead Channel Setting Pacing Amplitude: 2 V
Lead Channel Setting Sensing Sensitivity: 0.5 mV
MDC IDC LEAD IMPLANT DT: 20150910
MDC IDC LEAD LOCATION: 753860
MDC IDC MSMT LEADCHNL RA IMPEDANCE VALUE: 550 Ohm
MDC IDC MSMT LEADCHNL RV SENSING INTR AMPL: 12 mV
MDC IDC SESS DTM: 20161227073145
MDC IDC SET LEADCHNL RV PACING AMPLITUDE: 2.5 V
MDC IDC SET LEADCHNL RV PACING PULSEWIDTH: 0.5 ms
MDC IDC STAT BRADY AP VP PERCENT: 1 %
MDC IDC STAT BRADY RV PERCENT PACED: 1 %
Pulse Gen Serial Number: 7208008

## 2015-04-03 ENCOUNTER — Encounter: Payer: Self-pay | Admitting: Cardiology

## 2015-04-05 ENCOUNTER — Encounter: Payer: Self-pay | Admitting: Medical

## 2015-04-05 ENCOUNTER — Ambulatory Visit (INDEPENDENT_AMBULATORY_CARE_PROVIDER_SITE_OTHER): Payer: 59 | Admitting: Medical

## 2015-04-05 VITALS — BP 94/60 | HR 73 | Temp 100.7°F | Resp 14 | Wt 245.0 lb

## 2015-04-05 DIAGNOSIS — R509 Fever, unspecified: Secondary | ICD-10-CM

## 2015-04-05 DIAGNOSIS — J111 Influenza due to unidentified influenza virus with other respiratory manifestations: Secondary | ICD-10-CM

## 2015-04-05 LAB — POC INFLUENZA A&B (BINAX/QUICKVUE)
INFLUENZA B, POC: NEGATIVE
Influenza A, POC: POSITIVE — AB

## 2015-04-05 MED ORDER — HYDROCODONE-HOMATROPINE 5-1.5 MG/5ML PO SYRP: 5.0000 mL | ORAL_SOLUTION | Freq: Three times a day (TID) | ORAL | Status: DC | PRN
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
## 2015-05-30 ENCOUNTER — Encounter: Payer: Self-pay | Admitting: Internal Medicine

## 2015-06-19 ENCOUNTER — Encounter: Payer: 59 | Admitting: Internal Medicine

## 2015-07-15 ENCOUNTER — Ambulatory Visit (INDEPENDENT_AMBULATORY_CARE_PROVIDER_SITE_OTHER): Payer: 59 | Admitting: Internal Medicine

## 2015-07-15 ENCOUNTER — Encounter: Payer: Self-pay | Admitting: Internal Medicine

## 2015-07-15 VITALS — BP 124/72 | HR 53 | Ht 72.0 in | Wt 251.4 lb

## 2015-07-15 DIAGNOSIS — I472 Ventricular tachycardia, unspecified: Secondary | ICD-10-CM

## 2015-07-15 DIAGNOSIS — I422 Other hypertrophic cardiomyopathy: Secondary | ICD-10-CM | POA: Diagnosis not present

## 2015-07-15 LAB — CUP PACEART INCLINIC DEVICE CHECK
Battery Remaining Longevity: 92.4
Brady Statistic RA Percent Paced: 22 %
Brady Statistic RV Percent Paced: 0.06 %
Date Time Interrogation Session: 20170501154444
HighPow Impedance: 47.6548
Implantable Lead Implant Date: 20150910
Implantable Lead Implant Date: 20150910
Implantable Lead Location: 753859
Implantable Lead Location: 753860
Lead Channel Impedance Value: 362.5 Ohm
Lead Channel Impedance Value: 562.5 Ohm
Lead Channel Pacing Threshold Amplitude: 0.75 V
Lead Channel Pacing Threshold Amplitude: 1 V
Lead Channel Pacing Threshold Pulse Width: 0.5 ms
Lead Channel Pacing Threshold Pulse Width: 0.5 ms
Lead Channel Sensing Intrinsic Amplitude: 11.2 mV
Lead Channel Sensing Intrinsic Amplitude: 3 mV
Lead Channel Setting Pacing Amplitude: 2 V
Lead Channel Setting Pacing Amplitude: 2.5 V
Lead Channel Setting Pacing Pulse Width: 0.5 ms
Lead Channel Setting Sensing Sensitivity: 0.5 mV
Pulse Gen Serial Number: 7208008

## 2015-07-15 NOTE — Patient Instructions (Signed)
Medication Instructions:  Your physician recommends that you continue on your current medications as directed. Please refer to the Current Medication list given to you today.   Labwork: None ordered   Testing/Procedures: None ordered   Follow-Up: Your physician wants you to follow-up in: 12 months with Amber Seiler,NP You will receive a reminder letter in the mail two months in advance. If you don't receive a letter, please call our office to schedule the follow-up appointment.  Remote monitoring is used to monitor your ICD from home. This monitoring reduces the number of office visits required to check your device to one time per year. It allows us to keep an eye on the functioning of your device to ensure it is working properly. You are scheduled for a device check from home on 10/14/15. You may send your transmission at any time that day. If you have a wireless device, the transmission will be sent automatically. After your physician reviews your transmission, you will receive a postcard with your next transmission date.     Any Other Special Instructions Will Be Listed Below (If Applicable).     If you need a refill on your cardiac medications before your next appointment, please call your pharmacy.   
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
## 2015-07-29 ENCOUNTER — Encounter: Payer: Self-pay | Admitting: Cardiology

## 2015-07-29 NOTE — Progress Notes (Signed)
Patient ID: Nathan [name], male   DOB: December 02, 1960, 55 y.o.   MRN: [id number]  [name], Por    Date of visit:  07/29/2015 DOB:  1960-04-07    Age:  55 yrs. Medical record number:  [id number]     Account number:  [id number] Primary Care Provider: Robyne Askew ____________________________ CURRENT DIAGNOSES  1. Other hypertrophic cardiomyopathy  2. Abnormal electrocardiogram [ECG] [EKG]  3. Presence of automatic (implantable) cardiac defibrillator  4. Ventricular tachycardia  5. Obesity  6. Dyspnea, unspecified ____________________________ ALLERGIES  No Known Drug Allergies ____________________________ MEDICATIONS  1. metoprolol succinate ER 100 mg tablet,extended release 24 hr, 1 qam  1/2 qpm  2. Tylenol Extra Strength 500 mg tablet, PRN  3. rosuvastatin 10 mg tablet, 1 p.o. daily  4. Norpace CR 100 mg capsule,extended release, BID  5. Zyrtec 10 mg tablet, PRN ____________________________ CHIEF COMPLAINTS  Followup of Other hypertrophic cardiomyopathy ____________________________ HISTORY OF PRESENT ILLNESS Patient seen for cardiac followup. He continues to have some mild dyspnea with exertion. He is tolerating his Norpace well but notes that the sustained release form is no longer available and he is having to take regular Norpace now. He denies syncope and has had no defibrillator discharges. He has gained weight since he was here. He inquires about whether he might take a fishing to [place] and we discussed this as well as possible solutions to obtaining medical care while he is there. He denies PND, orthopnea or claudication. ____________________________ PAST HISTORY  Past Medical Illnesses:  obesity;  Cardiovascular Illnesses:  apical hypertrophic cardiomyopathy, ventricular tachycardia;  Surgical Procedures:  no prior surgical procedures;  NYHA Classification:  I;  Canadian Angina Classification:  Class 0: Asymptomatic;  Cardiology Procedures-Invasive:  cardiac cath (left) December  2013, AICD implant September 2015;  Cardiology Procedures-Noninvasive:  echocardiogram December 2013, cardiac MRI December 2013, event monitor August 2015, echocardiogram September 2015;  Cardiac Cath Results:  normal coronary arteries;  LVEF of 65% documented via cardiac cath on 02/25/2012,   ____________________________ CARDIO-PULMONARY TEST DATES EKG Date:  07/17/2014;   Cardiac Cath Date:  02/25/2012;  Holter/Event Monitor Date: 10/26/2013;  Echocardiography Date: 11/15/2013;  Chest Xray Date: 02/22/2012;  CT Scan Date:  03/03/2012   ____________________________ FAMILY HISTORY Brother -- Brother alive with problem, Hypertension Father -- Father alive with problem, Diabetes mellitus, Coronary Artery Disease Mother -- Mother alive and well ____________________________ SOCIAL HISTORY Alcohol Use:  occasionally;  Smoking:  never smoked;  Diet:  regular diet;  Lifestyle:  married;  Exercise:  no regular exercise;  Occupation:  Clinical cytogeneticist;  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 Respiratory: denies dyspnea, cough, wheezing or hemoptysis. Cardiovascular:  please review HPI  Genitourinary-Male: no dysuria, urgency, frequency, or nocturia  Musculoskeletal:  arthritis of the knees  ____________________________ PHYSICAL EXAMINATION VITAL SIGNS  Blood Pressure:  114/70 Sitting, Right arm, large cuff  , 108/64 Standing, Right arm and large cuff   Pulse:  60/min. Weight:  252.00 lbs. Height:  73"BMI: 33  Constitutional:  pleasant white male in no acute distress, mildly obese Skin:  warm and dry to touch, no apparent skin lesions, or masses noted. Head:  normocephalic, normal hair pattern, no masses or tenderness Neck:  supple, without massess. No JVD, thyromegaly or carotid bruits. Carotid upstroke normal. Chest:  normal symmetry, clear to auscultation. Cardiac:  regular rhythm, normal S1  and S2, No S3 or S4, no murmurs,  gallops or rubs detected. Extremities & Back:  no deformities, clubbing, cyanosis, erythema or edema observed. Normal muscle strength and tone. ____________________________ IMPRESSIONS/PLAN  1. Apical hypertrophic cardiomyopathy currently stable 2. Ventricular tachycardia treated with Norpace 3. Obesity with need to lose weight 4. Dyspnea currently stable  Recommendations:  Clinically doing reasonably well. We discussed his fishing trip to [place]. Recommended followup in 6 months. Continue defibrillator checks. Discussed the need for weight loss with him.   ____________________________ TODAYS ORDERS  1. Return Visit: 6 months  2. 12 Lead EKG: 6 months                       ____________________________ Cardiology Physician:  Kerry Hough MD [facility]

## 2015-10-14 ENCOUNTER — Ambulatory Visit (INDEPENDENT_AMBULATORY_CARE_PROVIDER_SITE_OTHER): Payer: 59 | Admitting: *Deleted

## 2015-10-14 DIAGNOSIS — I472 Ventricular tachycardia, unspecified: Secondary | ICD-10-CM

## 2015-10-14 NOTE — Progress Notes (Signed)
Remote ICD transmission.   

## 2015-10-22 ENCOUNTER — Encounter: Payer: Self-pay | Admitting: Cardiology

## 2015-10-24 LAB — CUP PACEART REMOTE DEVICE CHECK
Battery Remaining Longevity: 82 mo
Battery Remaining Percentage: 80 %
Battery Voltage: 2.99 V
Brady Statistic AP VS Percent: 24 %
Brady Statistic AS VP Percent: 1 %
Brady Statistic AS VS Percent: 75 %
Brady Statistic RA Percent Paced: 23 %
Brady Statistic RV Percent Paced: 1 %
Date Time Interrogation Session: 20170731060016
HighPow Impedance: 52 Ohm
HighPow Impedance: 52 Ohm
Implantable Lead Implant Date: 20150910
Implantable Lead Location: 753860
Lead Channel Impedance Value: 360 Ohm
Lead Channel Impedance Value: 600 Ohm
Lead Channel Pacing Threshold Amplitude: 0.75 V
Lead Channel Pacing Threshold Pulse Width: 0.5 ms
Lead Channel Pacing Threshold Pulse Width: 0.5 ms
Lead Channel Sensing Intrinsic Amplitude: 12 mV
Lead Channel Sensing Intrinsic Amplitude: 4 mV
Lead Channel Setting Pacing Amplitude: 2 V
Lead Channel Setting Pacing Amplitude: 2.5 V
Lead Channel Setting Pacing Pulse Width: 0.5 ms
Lead Channel Setting Sensing Sensitivity: 0.5 mV
MDC IDC LEAD IMPLANT DT: 20150910
MDC IDC LEAD LOCATION: 753859
MDC IDC MSMT LEADCHNL RA PACING THRESHOLD AMPLITUDE: 1 V
MDC IDC PG SERIAL: 7208008
MDC IDC STAT BRADY AP VP PERCENT: 1 %

## 2015-11-01 ENCOUNTER — Telehealth: Payer: Self-pay | Admitting: Internal Medicine

## 2015-11-01 NOTE — Telephone Encounter (Signed)
New message     1. Has your device fired? unknown  2. Is you device beeping? no  3. Are you experiencing draining or swelling at device site? no  4. Are you calling to see if we received your device transmission? No, pt is calling because he said he felt strange Monday night and he wants to know if anything showed up on the device readings 10/28/15 at night  5. Have you passed out? No    Pt said he is not having any pain but he is having a feeling that he cant describe

## 2015-11-01 NOTE — Telephone Encounter (Signed)
Spoke w/ pt and requested that he send a remote transmission. Pt verbalized understanding and said he would do it later today when he is home.

## 2015-11-01 NOTE — Telephone Encounter (Signed)
Remote received. Normal device function. Presenting EGM: AsVs ~60bpm. No atrial or ventricular arrhythmias recorded on 10/28/2015.  Called patient and informed him of device findings. Patient voiced understanding.

## 2015-12-31 ENCOUNTER — Other Ambulatory Visit: Payer: Self-pay | Admitting: Internal Medicine

## 2016-01-13 ENCOUNTER — Ambulatory Visit (INDEPENDENT_AMBULATORY_CARE_PROVIDER_SITE_OTHER): Payer: Self-pay | Admitting: *Deleted

## 2016-01-13 DIAGNOSIS — I472 Ventricular tachycardia, unspecified: Secondary | ICD-10-CM

## 2016-01-14 NOTE — Progress Notes (Signed)
Remote ICD transmission.   

## 2016-01-17 ENCOUNTER — Encounter: Payer: Self-pay | Admitting: Cardiology

## 2016-02-24 LAB — CUP PACEART REMOTE DEVICE CHECK
Battery Remaining Longevity: 80 mo
Battery Voltage: 2.98 V
Brady Statistic AP VS Percent: 25 %
Brady Statistic AS VP Percent: 1 %
Brady Statistic RA Percent Paced: 23 %
Date Time Interrogation Session: 20171030071428
HIGH POWER IMPEDANCE MEASURED VALUE: 46 Ohm
HighPow Impedance: 46 Ohm
Implantable Lead Implant Date: 20150910
Implantable Lead Implant Date: 20150910
Implantable Lead Location: 753859
Implantable Lead Location: 753860
Lead Channel Impedance Value: 550 Ohm
Lead Channel Pacing Threshold Amplitude: 0.75 V
Lead Channel Pacing Threshold Pulse Width: 0.5 ms
Lead Channel Pacing Threshold Pulse Width: 0.5 ms
Lead Channel Sensing Intrinsic Amplitude: 12 mV
Lead Channel Setting Pacing Amplitude: 2 V
Lead Channel Setting Pacing Pulse Width: 0.5 ms
Lead Channel Setting Sensing Sensitivity: 0.5 mV
MDC IDC MSMT BATTERY REMAINING PERCENTAGE: 78 %
MDC IDC MSMT LEADCHNL RA PACING THRESHOLD AMPLITUDE: 1 V
MDC IDC MSMT LEADCHNL RA SENSING INTR AMPL: 3 mV
MDC IDC MSMT LEADCHNL RV IMPEDANCE VALUE: 350 Ohm
MDC IDC PG IMPLANT DT: 20150910
MDC IDC SET LEADCHNL RV PACING AMPLITUDE: 2.5 V
MDC IDC STAT BRADY AP VP PERCENT: 1 %
MDC IDC STAT BRADY AS VS PERCENT: 74 %
MDC IDC STAT BRADY RV PERCENT PACED: 1 %
Pulse Gen Serial Number: 7208008

## 2016-04-13 ENCOUNTER — Ambulatory Visit (INDEPENDENT_AMBULATORY_CARE_PROVIDER_SITE_OTHER): Payer: Self-pay | Admitting: *Deleted

## 2016-04-13 DIAGNOSIS — I472 Ventricular tachycardia, unspecified: Secondary | ICD-10-CM

## 2016-04-13 NOTE — Progress Notes (Signed)
Remote ICD transmission.   

## 2016-04-14 ENCOUNTER — Encounter: Payer: Self-pay | Admitting: Cardiology

## 2016-04-17 LAB — CUP PACEART REMOTE DEVICE CHECK
Battery Remaining Longevity: 77 mo
Battery Remaining Percentage: 75 %
Battery Voltage: 2.98 V
Brady Statistic AP VS Percent: 25 %
Brady Statistic AS VS Percent: 74 %
Brady Statistic RA Percent Paced: 23 %
Brady Statistic RV Percent Paced: 1 %
HIGH POWER IMPEDANCE MEASURED VALUE: 47 Ohm
HighPow Impedance: 47 Ohm
Implantable Lead Implant Date: 20150910
Implantable Lead Location: 753859
Implantable Lead Location: 753860
Implantable Pulse Generator Implant Date: 20150910
Lead Channel Impedance Value: 560 Ohm
Lead Channel Pacing Threshold Amplitude: 0.75 V
Lead Channel Pacing Threshold Amplitude: 1 V
Lead Channel Pacing Threshold Pulse Width: 0.5 ms
Lead Channel Sensing Intrinsic Amplitude: 12 mV
Lead Channel Sensing Intrinsic Amplitude: 3.1 mV
Lead Channel Setting Pacing Amplitude: 2 V
Lead Channel Setting Pacing Amplitude: 2.5 V
Lead Channel Setting Pacing Pulse Width: 0.5 ms
Lead Channel Setting Sensing Sensitivity: 0.5 mV
MDC IDC LEAD IMPLANT DT: 20150910
MDC IDC MSMT LEADCHNL RA PACING THRESHOLD PULSEWIDTH: 0.5 ms
MDC IDC MSMT LEADCHNL RV IMPEDANCE VALUE: 350 Ohm
MDC IDC SESS DTM: 20180129070029
MDC IDC STAT BRADY AP VP PERCENT: 1 %
MDC IDC STAT BRADY AS VP PERCENT: 1 %
Pulse Gen Serial Number: 7208008

## 2016-06-27 ENCOUNTER — Other Ambulatory Visit: Payer: Self-pay | Admitting: Internal Medicine
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
## 2016-07-14 ENCOUNTER — Encounter: Payer: Self-pay | Admitting: Nurse Practitioner

## 2016-07-14 ENCOUNTER — Ambulatory Visit (INDEPENDENT_AMBULATORY_CARE_PROVIDER_SITE_OTHER): Payer: 59 | Admitting: Nurse Practitioner

## 2016-07-14 VITALS — BP 100/66 | HR 50 | Ht 72.0 in | Wt 241.0 lb

## 2016-07-14 DIAGNOSIS — I422 Other hypertrophic cardiomyopathy: Secondary | ICD-10-CM | POA: Diagnosis not present

## 2016-07-14 DIAGNOSIS — I472 Ventricular tachycardia, unspecified: Secondary | ICD-10-CM

## 2016-07-14 LAB — CUP PACEART INCLINIC DEVICE CHECK
Implantable Lead Implant Date: 20150910
Implantable Lead Implant Date: 20150910
Implantable Lead Location: 753859
Implantable Pulse Generator Implant Date: 20150910
MDC IDC LEAD LOCATION: 753860
MDC IDC SESS DTM: 20180501080927
Pulse Gen Serial Number: 7208008

## 2016-07-14 NOTE — Patient Instructions (Signed)
Medication Instructions:  None Ordered   Labwork: None Ordered   Testing/Procedures: Remote monitoring is used to monitor your Pacemaker from home. This monitoring reduces the number of office visits required to check your device to one time per year. It allows [name] to keep an eye on the functioning of your device to ensure it is working properly. You are scheduled for a device check from home on 10/13/2016. You may send your transmission at any time that day. If you have a wireless device, the transmission will be sent automatically. After your physician reviews your transmission, you will receive a postcard with your next transmission date.    Follow-Up: Your physician wants you to follow-up in: 1 year with Dr. Rayann Heman. You will receive a reminder letter in the mail two months in advance. If you don't receive a letter, please call our office to schedule the follow-up appointment.   Any Other Special Instructions Will Be Listed Below (If Applicable).     If you need a refill on your cardiac medications before your next appointment, please call your pharmacy.

## 2016-09-15 ENCOUNTER — Encounter: Payer: Self-pay | Admitting: Family Medicine

## 2016-09-26 ENCOUNTER — Other Ambulatory Visit: Payer: Self-pay | Admitting: Internal Medicine

## 2016-10-05 ENCOUNTER — Telehealth: Payer: Self-pay | Admitting: Internal Medicine

## 2016-10-05 NOTE — Telephone Encounter (Signed)
Pt calling to request genetic testing w/ Dr.Joseph -needs a referral-pls call [phone]

## 2016-10-06 NOTE — Telephone Encounter (Signed)
Patient's son's MD at [facility] advised that he be tested to see if he is positive for the HOCM gene.  I let him know I would discuss with Dr Rayann Heman and get back with him.

## 2016-10-09 NOTE — Telephone Encounter (Signed)
I think that genetic testing would be very reasonable.  Please make sure that he is scheduled to see Dr Broadus John for genetic consultation.

## 2016-10-12 NOTE — Telephone Encounter (Signed)
Sent Gracy Bruins a staff message to have patient scheduled with Dr Broadus John.

## 2016-10-13 ENCOUNTER — Telehealth: Payer: Self-pay | Admitting: Cardiology

## 2016-10-13 ENCOUNTER — Ambulatory Visit (INDEPENDENT_AMBULATORY_CARE_PROVIDER_SITE_OTHER): Payer: 59 | Admitting: *Deleted

## 2016-10-13 DIAGNOSIS — I472 Ventricular tachycardia, unspecified: Secondary | ICD-10-CM

## 2016-10-13 NOTE — Progress Notes (Signed)
Remote ICD transmission.   

## 2016-10-13 NOTE — Telephone Encounter (Signed)
Spoke with pt and reminded pt of remote transmission that is due today. Pt verbalized understanding.   

## 2016-10-16 ENCOUNTER — Encounter: Payer: Self-pay | Admitting: Cardiology

## 2016-11-05 ENCOUNTER — Ambulatory Visit: Payer: 59 | Admitting: Genetic Counselor

## 2016-11-09 NOTE — Consult Note (Signed)
Nathan [name] was referred for genetic testing of hypertrophic cardiomyopathy. He was counseled on the genetics of hypertrophic cardiomyopathy (HCM), namely inheritance, incomplete penetrance, variable expression and digenic/compound mutations that can be seen in some patients with HCM. We also briefly discussed the infiltrative cardiomyopathies, their inheritance pattern and treatment plans. We walked through the process of genetic testing. The potential outcomes of genetic testing and subsequent management of at-risk family members were discussed so as to manage expectations. His medical and 4-generation family history was obtained. 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 He reports that he began noticing that he was out of breath and had to sit up to sleep about 4-5 years ago. At that time, he attributed his breathlessness to being out of shape. He states that he was unable to climb a hill when he went hunting. Recognizing that there must be another reason to his breathlessness he went to his primary care provider who performed an EKG. His EKG results were significant enough that he was referred to a cardiologist. After undergoing a battery of tests, his cardiac MRI indicated that hypertrophy in the apical segments with scarring. He states that he had a Holter monitor and was called several times letting him know that his heart rhythm. He informs me that an ICD was placed in December of that year. No shocks have been delivered since. Currently, he reports having shortness of breath,very few dizzy spells and what he calls "muscle spasms" in his chest. He is very concerned about the risk of HCM in his children especially considering his presentation and family history.     Family history [name] (III.3) is the older of two siblings. His younger brother (III.4) and his son (IV.3) have had normal  screening for HCM last year. Nathan [name] has two children. His daughter (IV.1) age 3 was a Engineer, manufacturing and lives in [place]. She has been asked to scale back her swimming because of her dad's diagnosis of HCM. Her EKG and echocardiogram were normal two years ago. She is set to follow up every two year for HCM evaluation. Nathan [name]'s 76 year old son (IV.2) is at [employer] and is not swimming any more. He is being followed up yearly for HCM. He had normal imaging results last year.  Nathan [name]'s dad (II.5) has CAD and had stents placed when he was 53 and again at 29. Nathan [name] reports that he does not have HCM. Nathan [name]'s dad had four siblings; all have passed away. He reports three paternal relatives that died suddenly. His paternal aunt (II.4) died in her sleep. Her son found her dead the next day. He reports that she had heart trouble and was a heavy smoker. Her son (III.1) also died suddenly at age 81. An autopsy was performed and Nathan [name] was told that he died of an enlarged heart. He does not know the exact events that led to his death. [name]'s paternal grandfather was found dead in the alley behind their house. Nathan [name] says the he had heart issues for the last ten years of his life.   Impression and Plans  In summary, Nathan [name]'s severity of disease presentation and family history of HCM and sudden death is indicative of a genetic condition. He reports three family members that died suddenly, namely his paternal grandfather, paternal aunt and her son who was confirmed to have HCM by autopsy.   In light of his family history of sudden death  and nature of presentation genetic testing for HCM is highly recommended. This test includes genes that encode components of the sarcomeric contractile apparatus and HCM phenocopies. Genetic testing will confirm the diagnosis and identify the genetic basis of the disease. It will also help identify first-degree family members (parent, sibling and children) that may harbor  the mutation. As HCM is an autosomal dominant disorder, his children are at a 50% risk of inheriting his condition. Genetic testing his son for the familial pathogenic variant will help stratify risk for HCM in the family. Appropriate cardiology follow-up and lifestyle management can then be directed to genotype-positive family members.                                                                                                                                                                                                                                                                  [name], Ph.D, [facility]

## 2016-11-17 LAB — CUP PACEART REMOTE DEVICE CHECK
Battery Remaining Percentage: 71 %
Battery Voltage: 2.96 V
Brady Statistic AP VP Percent: 1 %
Brady Statistic AS VS Percent: 70 %
Date Time Interrogation Session: 20180731182053
HIGH POWER IMPEDANCE MEASURED VALUE: 49 Ohm
HighPow Impedance: 49 Ohm
Implantable Lead Implant Date: 20150910
Implantable Lead Location: 753860
Implantable Pulse Generator Implant Date: 20150910
Lead Channel Impedance Value: 390 Ohm
Lead Channel Pacing Threshold Amplitude: 0.75 V
Lead Channel Pacing Threshold Pulse Width: 0.5 ms
Lead Channel Sensing Intrinsic Amplitude: 9.1 mV
Lead Channel Setting Pacing Amplitude: 2 V
Lead Channel Setting Sensing Sensitivity: 0.5 mV
MDC IDC LEAD IMPLANT DT: 20150910
MDC IDC LEAD LOCATION: 753859
MDC IDC MSMT BATTERY REMAINING LONGEVITY: 73 mo
MDC IDC MSMT LEADCHNL RA IMPEDANCE VALUE: 550 Ohm
MDC IDC MSMT LEADCHNL RA SENSING INTR AMPL: 3.3 mV
MDC IDC MSMT LEADCHNL RV PACING THRESHOLD AMPLITUDE: 1 V
MDC IDC MSMT LEADCHNL RV PACING THRESHOLD PULSEWIDTH: 0.5 ms
MDC IDC SET LEADCHNL RV PACING AMPLITUDE: 2.5 V
MDC IDC SET LEADCHNL RV PACING PULSEWIDTH: 0.5 ms
MDC IDC STAT BRADY AP VS PERCENT: 30 %
MDC IDC STAT BRADY AS VP PERCENT: 1 %
MDC IDC STAT BRADY RA PERCENT PACED: 28 %
MDC IDC STAT BRADY RV PERCENT PACED: 1 %
Pulse Gen Serial Number: 7208008

## 2017-01-12 ENCOUNTER — Ambulatory Visit (INDEPENDENT_AMBULATORY_CARE_PROVIDER_SITE_OTHER): Payer: 59 | Admitting: *Deleted

## 2017-01-12 DIAGNOSIS — I472 Ventricular tachycardia, unspecified: Secondary | ICD-10-CM

## 2017-01-12 NOTE — Progress Notes (Signed)
Remote ICD transmission.   

## 2017-01-14 ENCOUNTER — Telehealth: Payer: Self-pay | Admitting: Genetic Counselor

## 2017-01-20 ENCOUNTER — Encounter: Payer: Self-pay | Admitting: Cardiology

## 2017-01-28 ENCOUNTER — Ambulatory Visit: Payer: 59 | Admitting: Genetic Counselor

## 2017-02-01 LAB — CUP PACEART REMOTE DEVICE CHECK
Battery Remaining Longevity: 70 mo
Battery Remaining Percentage: 69 %
Battery Voltage: 2.98 V
Brady Statistic AP VS Percent: 29 %
Brady Statistic AS VP Percent: 1 %
Brady Statistic AS VS Percent: 70 %
Brady Statistic RA Percent Paced: 27 %
Brady Statistic RV Percent Paced: 1 %
Date Time Interrogation Session: 20181030060017
HIGH POWER IMPEDANCE MEASURED VALUE: 50 Ohm
HIGH POWER IMPEDANCE MEASURED VALUE: 50 Ohm
Implantable Lead Implant Date: 20150910
Implantable Lead Location: 753859
Lead Channel Impedance Value: 350 Ohm
Lead Channel Pacing Threshold Amplitude: 1 V
Lead Channel Pacing Threshold Pulse Width: 0.5 ms
Lead Channel Sensing Intrinsic Amplitude: 9.9 mV
Lead Channel Setting Pacing Amplitude: 2 V
Lead Channel Setting Pacing Amplitude: 2.5 V
Lead Channel Setting Pacing Pulse Width: 0.5 ms
Lead Channel Setting Sensing Sensitivity: 0.5 mV
MDC IDC LEAD IMPLANT DT: 20150910
MDC IDC LEAD LOCATION: 753860
MDC IDC MSMT LEADCHNL RA IMPEDANCE VALUE: 530 Ohm
MDC IDC MSMT LEADCHNL RA PACING THRESHOLD AMPLITUDE: 0.75 V
MDC IDC MSMT LEADCHNL RA PACING THRESHOLD PULSEWIDTH: 0.5 ms
MDC IDC MSMT LEADCHNL RA SENSING INTR AMPL: 3.2 mV
MDC IDC PG IMPLANT DT: 20150910
MDC IDC STAT BRADY AP VP PERCENT: 1 %
Pulse Gen Serial Number: 7208008

## 2017-02-02 NOTE — Progress Notes (Signed)
Nathan [name] is here today for a post-test GC consult. We went over his pedigree and he confirmed that there have been no changes to the status of his family members since we spoke last in August 2018. He was informed that he did not harbor a pathogenic variant in genes implicated in HCM. 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 I explained to him that though this variant is very rare amongst individuals of European descent, computational algorithms indicate that it does no impact gene structure or function. Hence this is most likely a rare benign variant. In the absence of actual functional evidence, this variant is classified as a variant of unknown significance. In light of this information, further testing of his children is not recommended. He verbalized understanding of the relevance of his test report.   Taiyo expressed interest in enrolling in the Cardiomyopathy Research study at the [place] lab in [employer]. I reviewed the criteria for enrolling, namely the patient had to be phenotype positive and genotype negative. We went over the details of the informed consent for the study. He signed off on the Informed Consent and Medical Release forms for the study. Blood was drawn today and will be sent out to the [place] lab for the study.   Kristine Garbe has a son and daughter. I recommended that he inform his family about his HCM test result and they update their primary care provider about his diagnosis so they can obtain an EKG and echocardiogram at appropriate intervals. He states that they are being routinely followed up for HCM.  [name], Ph.D, [facility] Clinical Molecular Geneticist

## 2017-03-18 NOTE — Telephone Encounter (Signed)
error 

## 2017-04-13 ENCOUNTER — Ambulatory Visit (INDEPENDENT_AMBULATORY_CARE_PROVIDER_SITE_OTHER): Payer: 59 | Admitting: *Deleted

## 2017-04-13 DIAGNOSIS — I472 Ventricular tachycardia, unspecified: Secondary | ICD-10-CM

## 2017-04-13 NOTE — Progress Notes (Signed)
Remote ICD transmission.   

## 2017-04-15 ENCOUNTER — Encounter: Payer: Self-pay | Admitting: Cardiology

## 2017-04-23 ENCOUNTER — Encounter: Payer: Self-pay | Admitting: Internal Medicine

## 2017-04-28 LAB — CUP PACEART REMOTE DEVICE CHECK
Brady Statistic AP VP Percent: 1 %
Brady Statistic AP VS Percent: 27 %
Brady Statistic AS VP Percent: 1 %
Brady Statistic RA Percent Paced: 25 %
Brady Statistic RV Percent Paced: 1 %
Date Time Interrogation Session: 20190129075017
HighPow Impedance: 52 Ohm
HighPow Impedance: 52 Ohm
Implantable Lead Implant Date: 20150910
Implantable Lead Location: 753859
Lead Channel Impedance Value: 550 Ohm
Lead Channel Pacing Threshold Amplitude: 0.75 V
Lead Channel Pacing Threshold Amplitude: 1 V
Lead Channel Pacing Threshold Pulse Width: 0.5 ms
Lead Channel Sensing Intrinsic Amplitude: 3.6 mV
Lead Channel Setting Pacing Amplitude: 2.5 V
Lead Channel Setting Pacing Pulse Width: 0.5 ms
Lead Channel Setting Sensing Sensitivity: 0.5 mV
MDC IDC LEAD IMPLANT DT: 20150910
MDC IDC LEAD LOCATION: 753860
MDC IDC MSMT BATTERY REMAINING LONGEVITY: 68 mo
MDC IDC MSMT BATTERY REMAINING PERCENTAGE: 66 %
MDC IDC MSMT BATTERY VOLTAGE: 2.96 V
MDC IDC MSMT LEADCHNL RA PACING THRESHOLD PULSEWIDTH: 0.5 ms
MDC IDC MSMT LEADCHNL RV IMPEDANCE VALUE: 360 Ohm
MDC IDC MSMT LEADCHNL RV SENSING INTR AMPL: 9.6 mV
MDC IDC PG IMPLANT DT: 20150910
MDC IDC PG SERIAL: 7208008
MDC IDC SET LEADCHNL RA PACING AMPLITUDE: 2 V
MDC IDC STAT BRADY AS VS PERCENT: 72 %

## 2017-07-13 ENCOUNTER — Ambulatory Visit (INDEPENDENT_AMBULATORY_CARE_PROVIDER_SITE_OTHER): Payer: 59 | Admitting: *Deleted

## 2017-07-13 DIAGNOSIS — I422 Other hypertrophic cardiomyopathy: Secondary | ICD-10-CM

## 2017-07-13 NOTE — Progress Notes (Signed)
Remote ICD transmission.   

## 2017-07-14 ENCOUNTER — Encounter: Payer: Self-pay | Admitting: Cardiology

## 2017-07-19 ENCOUNTER — Encounter: Payer: 59 | Admitting: Internal Medicine

## 2017-08-03 LAB — CUP PACEART REMOTE DEVICE CHECK
Battery Remaining Percentage: 65 %
Battery Voltage: 2.96 V
Brady Statistic AP VP Percent: 1 %
Brady Statistic AS VP Percent: 1 %
Brady Statistic RA Percent Paced: 25 %
HIGH POWER IMPEDANCE MEASURED VALUE: 48 Ohm
HighPow Impedance: 48 Ohm
Implantable Lead Implant Date: 20150910
Implantable Lead Location: 753859
Implantable Pulse Generator Implant Date: 20150910
Lead Channel Impedance Value: 360 Ohm
Lead Channel Impedance Value: 530 Ohm
Lead Channel Pacing Threshold Amplitude: 1 V
Lead Channel Pacing Threshold Pulse Width: 0.5 ms
Lead Channel Sensing Intrinsic Amplitude: 8.6 mV
Lead Channel Setting Pacing Amplitude: 2 V
Lead Channel Setting Pacing Amplitude: 2.5 V
Lead Channel Setting Pacing Pulse Width: 0.5 ms
Lead Channel Setting Sensing Sensitivity: 0.5 mV
MDC IDC LEAD IMPLANT DT: 20150910
MDC IDC LEAD LOCATION: 753860
MDC IDC MSMT BATTERY REMAINING LONGEVITY: 66 mo
MDC IDC MSMT LEADCHNL RA PACING THRESHOLD AMPLITUDE: 0.75 V
MDC IDC MSMT LEADCHNL RA PACING THRESHOLD PULSEWIDTH: 0.5 ms
MDC IDC MSMT LEADCHNL RA SENSING INTR AMPL: 3.2 mV
MDC IDC PG SERIAL: 7208008
MDC IDC SESS DTM: 20190430060017
MDC IDC STAT BRADY AP VS PERCENT: 27 %
MDC IDC STAT BRADY AS VS PERCENT: 73 %
MDC IDC STAT BRADY RV PERCENT PACED: 1 %

## 2017-10-12 ENCOUNTER — Ambulatory Visit (INDEPENDENT_AMBULATORY_CARE_PROVIDER_SITE_OTHER): Payer: 59 | Admitting: *Deleted

## 2017-10-12 DIAGNOSIS — I422 Other hypertrophic cardiomyopathy: Secondary | ICD-10-CM | POA: Diagnosis not present

## 2017-10-12 NOTE — Progress Notes (Signed)
Remote ICD transmission.   

## 2017-10-13 ENCOUNTER — Encounter: Payer: Self-pay | Admitting: Cardiology

## 2017-11-13 LAB — CUP PACEART REMOTE DEVICE CHECK
Battery Remaining Longevity: 63 mo
Battery Remaining Percentage: 62 %
Battery Voltage: 2.96 V
Brady Statistic AP VS Percent: 25 %
Brady Statistic RA Percent Paced: 23 %
Date Time Interrogation Session: 20190730060016
HIGH POWER IMPEDANCE MEASURED VALUE: 46 Ohm
HIGH POWER IMPEDANCE MEASURED VALUE: 46 Ohm
Implantable Lead Location: 753859
Implantable Lead Location: 753860
Implantable Pulse Generator Implant Date: 20150910
Lead Channel Impedance Value: 350 Ohm
Lead Channel Impedance Value: 530 Ohm
Lead Channel Pacing Threshold Amplitude: 1 V
Lead Channel Sensing Intrinsic Amplitude: 9.8 mV
Lead Channel Setting Pacing Amplitude: 2 V
Lead Channel Setting Pacing Amplitude: 2.5 V
Lead Channel Setting Pacing Pulse Width: 0.5 ms
Lead Channel Setting Sensing Sensitivity: 0.5 mV
MDC IDC LEAD IMPLANT DT: 20150910
MDC IDC LEAD IMPLANT DT: 20150910
MDC IDC MSMT LEADCHNL RA PACING THRESHOLD AMPLITUDE: 0.75 V
MDC IDC MSMT LEADCHNL RA PACING THRESHOLD PULSEWIDTH: 0.5 ms
MDC IDC MSMT LEADCHNL RA SENSING INTR AMPL: 3.1 mV
MDC IDC MSMT LEADCHNL RV PACING THRESHOLD PULSEWIDTH: 0.5 ms
MDC IDC STAT BRADY AP VP PERCENT: 1 %
MDC IDC STAT BRADY AS VP PERCENT: 1 %
MDC IDC STAT BRADY AS VS PERCENT: 74 %
MDC IDC STAT BRADY RV PERCENT PACED: 1 %
Pulse Gen Serial Number: 7208008

## 2018-01-04 ENCOUNTER — Encounter: Payer: Self-pay | Admitting: Internal Medicine

## 2018-01-11 ENCOUNTER — Ambulatory Visit (INDEPENDENT_AMBULATORY_CARE_PROVIDER_SITE_OTHER): Payer: 59 | Admitting: *Deleted

## 2018-01-11 DIAGNOSIS — I472 Ventricular tachycardia, unspecified: Secondary | ICD-10-CM

## 2018-01-11 NOTE — Progress Notes (Signed)
Remote ICD transmission.   

## 2018-01-24 ENCOUNTER — Encounter: Payer: Self-pay | Admitting: Internal Medicine

## 2018-01-24 ENCOUNTER — Ambulatory Visit: Payer: 59 | Admitting: Internal Medicine

## 2018-01-24 VITALS — BP 112/66 | HR 55 | Ht 72.0 in | Wt 243.0 lb

## 2018-01-24 DIAGNOSIS — I422 Other hypertrophic cardiomyopathy: Secondary | ICD-10-CM | POA: Diagnosis not present

## 2018-01-24 DIAGNOSIS — Z95810 Presence of automatic (implantable) cardiac defibrillator: Secondary | ICD-10-CM

## 2018-01-24 DIAGNOSIS — I472 Ventricular tachycardia, unspecified: Secondary | ICD-10-CM

## 2018-01-24 NOTE — Patient Instructions (Addendum)
Medication Instructions:  Your physician recommends that you continue on your current medications as directed. Please refer to the Current Medication list given to you today.  Labwork: None ordered.  Testing/Procedures: None ordered.  Follow-Up: Your physician wants you to follow-up in: one year with Chanetta Marshall, NP.   You will receive a reminder letter in the mail two months in advance. If you don't receive a letter, please call our office to schedule the follow-up appointment.  Remote monitoring is used to monitor your ICD from home. This monitoring reduces the number of office visits required to check your device to one time per year. It allows [name] to keep an eye on the functioning of your device to ensure it is working properly. You are scheduled for a device check from home on 04/12/2018. You may send your transmission at any time that day. If you have a wireless device, the transmission will be sent automatically. After your physician reviews your transmission, you will receive a postcard with your next transmission date.  Any Other Special Instructions Will Be Listed Below (If Applicable).  If you need a refill on your cardiac medications before your next appointment, please call your pharmacy.
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
## 2018-01-26 LAB — CUP PACEART INCLINIC DEVICE CHECK
Battery Remaining Longevity: 66 mo
HIGH POWER IMPEDANCE MEASURED VALUE: 48.2324
Implantable Lead Implant Date: 20150910
Implantable Lead Location: 753859
Implantable Pulse Generator Implant Date: 20150910
Lead Channel Impedance Value: 387.5 Ohm
Lead Channel Impedance Value: 537.5 Ohm
Lead Channel Pacing Threshold Amplitude: 1 V
Lead Channel Pacing Threshold Amplitude: 1 V
Lead Channel Pacing Threshold Pulse Width: 0.5 ms
Lead Channel Pacing Threshold Pulse Width: 0.5 ms
Lead Channel Setting Pacing Amplitude: 2.5 V
Lead Channel Setting Pacing Pulse Width: 0.5 ms
Lead Channel Setting Sensing Sensitivity: 0.5 mV
MDC IDC LEAD IMPLANT DT: 20150910
MDC IDC LEAD LOCATION: 753860
MDC IDC MSMT LEADCHNL RA PACING THRESHOLD AMPLITUDE: 0.75 V
MDC IDC MSMT LEADCHNL RA PACING THRESHOLD AMPLITUDE: 0.75 V
MDC IDC MSMT LEADCHNL RA PACING THRESHOLD PULSEWIDTH: 0.5 ms
MDC IDC MSMT LEADCHNL RA SENSING INTR AMPL: 3.6 mV
MDC IDC MSMT LEADCHNL RV PACING THRESHOLD PULSEWIDTH: 0.5 ms
MDC IDC MSMT LEADCHNL RV SENSING INTR AMPL: 8.6 mV
MDC IDC SESS DTM: 20191111223016
MDC IDC SET LEADCHNL RA PACING AMPLITUDE: 2 V
MDC IDC STAT BRADY RA PERCENT PACED: 23 %
MDC IDC STAT BRADY RV PERCENT PACED: 0.21 %
Pulse Gen Serial Number: 7208008

## 2018-03-03 ENCOUNTER — Ambulatory Visit: Payer: 59 | Admitting: Cardiology

## 2018-03-03 ENCOUNTER — Encounter: Payer: Self-pay | Admitting: Cardiology

## 2018-03-03 VITALS — BP 140/68 | HR 59 | Ht 72.0 in | Wt 251.8 lb

## 2018-03-03 DIAGNOSIS — Z95810 Presence of automatic (implantable) cardiac defibrillator: Secondary | ICD-10-CM

## 2018-03-03 DIAGNOSIS — E785 Hyperlipidemia, unspecified: Secondary | ICD-10-CM | POA: Diagnosis not present

## 2018-03-03 DIAGNOSIS — IMO0001 Reserved for inherently not codable concepts without codable children: Secondary | ICD-10-CM

## 2018-03-03 DIAGNOSIS — Z0389 Encounter for observation for other suspected diseases and conditions ruled out: Secondary | ICD-10-CM

## 2018-03-03 DIAGNOSIS — I422 Other hypertrophic cardiomyopathy: Secondary | ICD-10-CM

## 2018-03-03 DIAGNOSIS — I472 Ventricular tachycardia, unspecified: Secondary | ICD-10-CM
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
## 2018-03-03 NOTE — Patient Instructions (Signed)
Medication Instructions:  Your physician recommends that you continue on your current medications as directed. Please refer to the Current Medication list given to you today.  If you need a refill on your cardiac medications before your next appointment, please call your pharmacy.   Lab work: None.  If you have labs (blood work) drawn today and your tests are completely normal, you will receive your results only by: . MyChart Message (if you have MyChart) OR . A paper copy in the mail If you have any lab test that is abnormal or we need to change your treatment, we will call you to review the results.  Testing/Procedures: Your physician has requested that you have an echocardiogram. Echocardiography is a painless test that uses sound waves to create images of your heart. It provides your doctor with information about the size and shape of your heart and how well your heart's chambers and valves are working. This procedure takes approximately one hour. There are no restrictions for this procedure.    Follow-Up: At [facility], you and your health needs are our priority.  As part of our continuing mission to provide you with exceptional heart care, we have created designated Provider Care Teams.  These Care Teams include your primary Cardiologist (physician) and Advanced Practice Providers (APPs -  Physician Assistants and Nurse Practitioners) who all work together to provide you with the care you need, when you need it. You will need a follow up appointment in 6 months.  Please call our office 2 months in advance to schedule this appointment.  You may see No primary care provider on file. or another member of our [facility] Provider Team in [place]: Brian Munley, MD . Rajan Revankar, MD  Any Other Special Instructions Will Be Listed Below (If Applicable).   Echocardiogram An echocardiogram is a procedure that uses painless sound waves (ultrasound) to produce an image of the  heart. Images from an echocardiogram can provide important information about:  Signs of coronary artery disease (CAD).  Aneurysm detection. An aneurysm is a weak or damaged part of an artery wall that bulges out from the normal force of blood pumping through the body.  Heart size and shape. Changes in the size or shape of the heart can be associated with certain conditions, including heart failure, aneurysm, and CAD.  Heart muscle function.  Heart valve function.  Signs of a past heart attack.  Fluid buildup around the heart.  Thickening of the heart muscle.  A tumor or infectious growth around the heart valves. Tell a health care provider about:  Any allergies you have.  All medicines you are taking, including vitamins, herbs, eye drops, creams, and over-the-counter medicines.  Any blood disorders you have.  Any surgeries you have had.  Any medical conditions you have.  Whether you are pregnant or may be pregnant. What are the risks? Generally, this is a safe procedure. However, problems may occur, including:  Allergic reaction to dye (contrast) that may be used during the procedure. What happens before the procedure? No specific preparation is needed. You may eat and drink normally. What happens during the procedure?   An IV tube may be inserted into one of your veins.  You may receive contrast through this tube. A contrast is an injection that improves the quality of the pictures from your heart.  A gel will be applied to your chest.  A wand-like tool (transducer) will be moved over your chest. The gel will help   to transmit the sound waves from the transducer.  The sound waves will harmlessly bounce off of your heart to allow the heart images to be captured in real-time motion. The images will be recorded on a computer. The procedure may vary among health care providers and hospitals. What happens after the procedure?  You may return to your normal, everyday  life, including diet, activities, and medicines, unless your health care provider tells you not to do that. Summary  An echocardiogram is a procedure that uses painless sound waves (ultrasound) to produce an image of the heart.  Images from an echocardiogram can provide important information about the size and shape of your heart, heart muscle function, heart valve function, and fluid buildup around your heart.  You do not need to do anything to prepare before this procedure. You may eat and drink normally.  After the echocardiogram is completed, you may return to your normal, everyday life, unless your health care provider tells you not to do that. This information is not intended to replace advice given to you by your health care provider. Make sure you discuss any questions you have with your health care provider. Document Released: 02/28/2000 Document Revised: 04/04/2016 Document Reviewed: 04/04/2016 Elsevier Interactive Patient Education  2019 [employer].

## 2018-03-03 NOTE — Addendum Note (Signed)
Addended by: Ashok Norris on: 03/03/2018 09:07 AM   Modules accepted: Orders

## 2018-03-11 ENCOUNTER — Ambulatory Visit (HOSPITAL_BASED_OUTPATIENT_CLINIC_OR_DEPARTMENT_OTHER): Payer: 59

## 2018-03-15 LAB — CUP PACEART REMOTE DEVICE CHECK
Battery Remaining Longevity: 61 mo
Brady Statistic AP VS Percent: 25 %
Brady Statistic AS VP Percent: 1 %
Brady Statistic RA Percent Paced: 23 %
Brady Statistic RV Percent Paced: 1 %
Date Time Interrogation Session: 20191029060016
HIGH POWER IMPEDANCE MEASURED VALUE: 47 Ohm
HighPow Impedance: 47 Ohm
Implantable Lead Implant Date: 20150910
Implantable Lead Location: 753859
Implantable Lead Location: 753860
Implantable Pulse Generator Implant Date: 20150910
Lead Channel Pacing Threshold Amplitude: 1 V
Lead Channel Pacing Threshold Pulse Width: 0.5 ms
Lead Channel Sensing Intrinsic Amplitude: 3 mV
Lead Channel Sensing Intrinsic Amplitude: 9.3 mV
Lead Channel Setting Pacing Amplitude: 2 V
Lead Channel Setting Pacing Pulse Width: 0.5 ms
Lead Channel Setting Sensing Sensitivity: 0.5 mV
MDC IDC LEAD IMPLANT DT: 20150910
MDC IDC MSMT BATTERY REMAINING PERCENTAGE: 59 %
MDC IDC MSMT BATTERY VOLTAGE: 2.96 V
MDC IDC MSMT LEADCHNL RA IMPEDANCE VALUE: 530 Ohm
MDC IDC MSMT LEADCHNL RA PACING THRESHOLD AMPLITUDE: 0.75 V
MDC IDC MSMT LEADCHNL RV IMPEDANCE VALUE: 360 Ohm
MDC IDC MSMT LEADCHNL RV PACING THRESHOLD PULSEWIDTH: 0.5 ms
MDC IDC SET LEADCHNL RV PACING AMPLITUDE: 2.5 V
MDC IDC STAT BRADY AP VP PERCENT: 1 %
MDC IDC STAT BRADY AS VS PERCENT: 74 %
Pulse Gen Serial Number: 7208008

## 2018-04-12 ENCOUNTER — Ambulatory Visit (INDEPENDENT_AMBULATORY_CARE_PROVIDER_SITE_OTHER): Payer: 59

## 2018-04-12 DIAGNOSIS — I472 Ventricular tachycardia, unspecified: Secondary | ICD-10-CM

## 2018-04-13 NOTE — Progress Notes (Signed)
Remote ICD transmission.   

## 2018-04-15 LAB — CUP PACEART REMOTE DEVICE CHECK
Battery Remaining Longevity: 58 mo
Battery Remaining Percentage: 57 %
Battery Voltage: 2.95 V
Brady Statistic AP VS Percent: 22 %
Brady Statistic AS VS Percent: 78 %
Brady Statistic RA Percent Paced: 20 %
Brady Statistic RV Percent Paced: 1 %
Date Time Interrogation Session: 20200128115116
HIGH POWER IMPEDANCE MEASURED VALUE: 47 Ohm
HighPow Impedance: 46 Ohm
Implantable Lead Implant Date: 20150910
Implantable Lead Implant Date: 20150910
Implantable Lead Location: 753859
Implantable Pulse Generator Implant Date: 20150910
Lead Channel Impedance Value: 360 Ohm
Lead Channel Impedance Value: 530 Ohm
Lead Channel Pacing Threshold Amplitude: 0.75 V
Lead Channel Pacing Threshold Amplitude: 1 V
Lead Channel Pacing Threshold Pulse Width: 0.5 ms
Lead Channel Pacing Threshold Pulse Width: 0.5 ms
Lead Channel Sensing Intrinsic Amplitude: 4.1 mV
Lead Channel Sensing Intrinsic Amplitude: 9.3 mV
Lead Channel Setting Pacing Amplitude: 2 V
Lead Channel Setting Pacing Amplitude: 2.5 V
Lead Channel Setting Pacing Pulse Width: 0.5 ms
Lead Channel Setting Sensing Sensitivity: 0.5 mV
MDC IDC LEAD LOCATION: 753860
MDC IDC STAT BRADY AP VP PERCENT: 1 %
MDC IDC STAT BRADY AS VP PERCENT: 1 %
Pulse Gen Serial Number: 7208008

## 2018-05-30 ENCOUNTER — Telehealth: Payer: Self-pay | Admitting: Cardiology

## 2018-05-30 NOTE — Telephone Encounter (Signed)
Please advise 

## 2018-05-30 NOTE — Telephone Encounter (Signed)
Patient would like to know if his condition makes him higher risk for Coronavirus.

## 2018-05-31 NOTE — Telephone Encounter (Signed)
Discussed with patient that any cardiac condition is at a higher risk, but his risk would definitely  put him at a more mild risk. I also advised to follow all protocols that have been in place from the government. He agreed and understood.

## 2018-05-31 NOTE — Telephone Encounter (Signed)
Any cardiac condition can put you on higher risk, this one as well but only mild increase risks

## 2018-06-20 ENCOUNTER — Other Ambulatory Visit: Payer: Self-pay | Admitting: Cardiology

## 2018-06-20 ENCOUNTER — Telehealth: Payer: Self-pay | Admitting: Cardiology

## 2018-06-20 MED ORDER — METOPROLOL SUCCINATE ER 100 MG PO TB24: ORAL_TABLET | ORAL | 0 refills | Status: DC

## 2018-06-20 MED ORDER — DISOPYRAMIDE PHOSPHATE 100 MG PO CAPS: 100.0000 mg | ORAL_CAPSULE | Freq: Two times a day (BID) | ORAL | 0 refills | Status: DC

## 2018-06-20 MED ORDER — ROSUVASTATIN CALCIUM 10 MG PO TABS: 10.0000 mg | ORAL_TABLET | Freq: Every day | ORAL | 0 refills | Status: DC

## 2018-06-20 NOTE — Telephone Encounter (Signed)
Rx sent in

## 2018-06-20 NOTE — Telephone Encounter (Signed)
Rx for Crestor, Norpace and Metoprolol sent to [facility]

## 2018-06-20 NOTE — Telephone Encounter (Signed)
°*  STAT* If patient is at the pharmacy, call can be transferred to refill team.   1. Which medications need to be refilled? (please list name of each medication and dose if known) Rosuvastatin 10MG  2. Which pharmacy/location (including street and city if local pharmacy) is medication to be sent to? [facility]  3. Do they need a 30 day or 90 day supply? 90 day

## 2018-06-20 NOTE — Telephone Encounter (Signed)
°*  STAT* If patient is at the pharmacy, call can be transferred to refill team.   1. Which medications need to be refilled? (please list name of each medication and dose if known) Metoprolol Succ ER TB100mg   2. Which pharmacy/location (including street and city if local pharmacy) is medication to be sent to? [facility]  3. Do they need a 30 day or 90 day supply? 90 day

## 2018-06-20 NOTE — Telephone Encounter (Signed)
°*  STAT* If patient is at the pharmacy, call can be transferred to refill team.   1. Which medications need to be refilled? (please list name of each medication and dose if known) Norpace CR CAP 100mg   2. Which pharmacy/location (including street and city if local pharmacy) is medication to be sent to?[facility]  3. Do they need a 30 day or 90 day supply? 90 day

## 2018-06-28 ENCOUNTER — Telehealth: Payer: Self-pay | Admitting: Cardiology

## 2018-06-28 ENCOUNTER — Other Ambulatory Visit: Payer: Self-pay

## 2018-06-28 MED ORDER — DISOPYRAMIDE PHOSPHATE ER 100 MG PO CP12: 100.0000 mg | ORAL_CAPSULE | Freq: Two times a day (BID) | ORAL | 1 refills | Status: DC

## 2018-06-28 NOTE — Telephone Encounter (Signed)
[facility] called, they've received refill order for Norpace 100 mg po BID. Pharmacy said patient has been on  Norpace CR 100 mg BID and wonders if change was intentional. Checked in chart as far back as 2017 when initially ordered. Chart shows regular Norpace ordered. Called pt to inquire how he's been taking medication. He states he was on the CR, it was in short supply so they gave him regular norpace. Then when CR became available, Dr. Wynonia Lawman switched him back to CR.      Patient would like to stay on CR.   Pls advise, tx!

## 2018-06-28 NOTE — Telephone Encounter (Signed)
Keep her on CR please

## 2018-06-28 NOTE — Telephone Encounter (Signed)
From [facility] pharmacy:  Their records indicate the patient was previously on Norpace CR 100mg  1 capsule twice daily. Your new prescription indicates a change to Norpace 100mg  1 capsult by mouth twice daily. Please verify if is should be filled as is or if it should be Norpace CR100mg  1 capsule twice daily 180 pills

## 2018-06-28 NOTE — Telephone Encounter (Signed)
Rx called to [facility] per pt preference

## 2018-07-07 ENCOUNTER — Encounter: Payer: Self-pay | Admitting: Family Medicine

## 2018-07-12 ENCOUNTER — Ambulatory Visit (INDEPENDENT_AMBULATORY_CARE_PROVIDER_SITE_OTHER): Payer: 59 | Admitting: *Deleted

## 2018-07-12 ENCOUNTER — Other Ambulatory Visit: Payer: Self-pay

## 2018-07-12 DIAGNOSIS — I422 Other hypertrophic cardiomyopathy: Secondary | ICD-10-CM

## 2018-07-12 DIAGNOSIS — I472 Ventricular tachycardia, unspecified: Secondary | ICD-10-CM

## 2018-07-12 LAB — CUP PACEART REMOTE DEVICE CHECK
Battery Remaining Longevity: 56 mo
Battery Remaining Percentage: 55 %
Battery Voltage: 2.95 V
Brady Statistic AP VP Percent: 1 %
Brady Statistic AP VS Percent: 22 %
Brady Statistic AS VP Percent: 1 %
Brady Statistic AS VS Percent: 77 %
Brady Statistic RA Percent Paced: 21 %
Brady Statistic RV Percent Paced: 1 %
Date Time Interrogation Session: 20200428062157
HighPow Impedance: 45 Ohm
HighPow Impedance: 45 Ohm
Implantable Lead Implant Date: 20150910
Implantable Lead Implant Date: 20150910
Implantable Lead Location: 753859
Implantable Lead Location: 753860
Implantable Pulse Generator Implant Date: 20150910
Lead Channel Impedance Value: 350 Ohm
Lead Channel Impedance Value: 490 Ohm
Lead Channel Pacing Threshold Amplitude: 0.75 V
Lead Channel Pacing Threshold Amplitude: 1 V
Lead Channel Pacing Threshold Pulse Width: 0.5 ms
Lead Channel Pacing Threshold Pulse Width: 0.5 ms
Lead Channel Sensing Intrinsic Amplitude: 2.7 mV
Lead Channel Sensing Intrinsic Amplitude: 8.2 mV
Lead Channel Setting Pacing Amplitude: 2 V
Lead Channel Setting Pacing Amplitude: 2.5 V
Lead Channel Setting Pacing Pulse Width: 0.5 ms
Lead Channel Setting Sensing Sensitivity: 0.5 mV
Pulse Gen Serial Number: 7208008

## 2018-07-21 NOTE — Progress Notes (Signed)
Remote ICD transmission.   

## 2018-08-11 ENCOUNTER — Encounter: Payer: Self-pay | Admitting: Internal Medicine

## 2018-08-24 ENCOUNTER — Ambulatory Visit (AMBULATORY_SURGERY_CENTER): Payer: Self-pay

## 2018-08-24 ENCOUNTER — Other Ambulatory Visit: Payer: Self-pay

## 2018-08-24 VITALS — Ht 72.0 in | Wt 250.0 lb

## 2018-08-24 DIAGNOSIS — Z1211 Encounter for screening for malignant neoplasm of colon: Secondary | ICD-10-CM

## 2018-08-24 MED ORDER — NA SULFATE-K SULFATE-MG SULF 17.5-3.13-1.6 GM/177ML PO SOLN: 1.0000 | Freq: Once | ORAL | 0 refills | Status: AC

## 2018-08-24 NOTE — Progress Notes (Signed)
No egg or soy allergy known to patient  No issues with past sedation with any surgeries  or procedures, no intubation problems  No diet pills per patient No home 02 use per patient  No blood thinners per patient  Pt denies issues with constipation  No A fib or A flutter  EMMI video sent to pt's e mail  

## 2018-08-26 ENCOUNTER — Other Ambulatory Visit: Payer: Self-pay | Admitting: Cardiology

## 2018-08-26 ENCOUNTER — Encounter: Payer: Self-pay | Admitting: Internal Medicine

## 2018-09-06 ENCOUNTER — Telehealth: Payer: Self-pay | Admitting: Internal Medicine

## 2018-09-06 NOTE — Telephone Encounter (Signed)
Spoke w/patient regarding Covid-19 Screening Questions: ° °Do you now or have you had a fever in the last 14 days? NO °Do you have any respiratory symptoms of shortness of breath or cough now or in the last 14 days? NO °Do you have any family members or close contacts with diagnosed or suspected Covid-19 in the past 14 days? NO °Have you been tested for Covid-19 and found to be positive? NO °

## 2018-09-07 ENCOUNTER — Other Ambulatory Visit: Payer: Self-pay

## 2018-09-07 ENCOUNTER — Encounter: Payer: Self-pay | Admitting: Internal Medicine

## 2018-09-07 ENCOUNTER — Ambulatory Visit (AMBULATORY_SURGERY_CENTER): Payer: 59 | Admitting: Internal Medicine

## 2018-09-07 VITALS — BP 102/72 | HR 50 | Temp 98.3°F | Resp 18 | Ht 72.0 in | Wt 250.0 lb

## 2018-09-07 DIAGNOSIS — Z1211 Encounter for screening for malignant neoplasm of colon: Secondary | ICD-10-CM | POA: Diagnosis not present

## 2018-09-07 DIAGNOSIS — D125 Benign neoplasm of sigmoid colon: Secondary | ICD-10-CM

## 2018-09-07 DIAGNOSIS — D122 Benign neoplasm of ascending colon: Secondary | ICD-10-CM

## 2018-09-07 DIAGNOSIS — K635 Polyp of colon: Secondary | ICD-10-CM

## 2018-09-07 MED ORDER — SODIUM CHLORIDE 0.9 % IV SOLN: 500.0000 mL | Freq: Once | INTRAVENOUS | Status: DC

## 2018-09-07 NOTE — Patient Instructions (Signed)
Information on polyps given to you today.  Await pathology results.   YOU HAD AN ENDOSCOPIC PROCEDURE TODAY AT THE [facility]:   Refer to the procedure report that was given to you for any specific questions about what was found during the examination.  If the procedure report does not answer your questions, please call your gastroenterologist to clarify.  If you requested that your care partner not be given the details of your procedure findings, then the procedure report has been included in a sealed envelope for you to review at your convenience later.  YOU SHOULD EXPECT: Some feelings of bloating in the abdomen. Passage of more gas than usual.  Walking can help get rid of the air that was put into your GI tract during the procedure and reduce the bloating. If you had a lower endoscopy (such as a colonoscopy or flexible sigmoidoscopy) you may notice spotting of blood in your stool or on the toilet paper. If you underwent a bowel prep for your procedure, you may not have a normal bowel movement for a few days.  Please Note:  You might notice some irritation and congestion in your nose or some drainage.  This is from the oxygen used during your procedure.  There is no need for concern and it should clear up in a day or so.  SYMPTOMS TO REPORT IMMEDIATELY:   Following lower endoscopy (colonoscopy or flexible sigmoidoscopy):  Excessive amounts of blood in the stool  Significant tenderness or worsening of abdominal pains  Swelling of the abdomen that is new, acute  Fever of 100F or higher   For urgent or emergent issues, a gastroenterologist can be reached at any hour by calling [phone].   DIET:  We do recommend a small meal at first, but then you may proceed to your regular diet.  Drink plenty of fluids but you should avoid alcoholic beverages for 24 hours.  ACTIVITY:  You should plan to take it easy for the rest of today and you should NOT DRIVE or use heavy machinery  until tomorrow (because of the sedation medicines used during the test).    FOLLOW UP: Our staff will call the number listed on your records 48-72 hours following your procedure to check on you and address any questions or concerns that you may have regarding the information given to you following your procedure. If we do not reach you, we will leave a message.  We will attempt to reach you two times.  During this call, we will ask if you have developed any symptoms of COVID 19. If you develop any symptoms (ie: fever, flu-like symptoms, shortness of breath, cough etc.) before then, please call [phone].  If you test positive for Covid 19 in the 2 weeks post procedure, please call and report this information to us.    If any biopsies were taken you will be contacted by phone or by letter within the next 1-3 weeks.  Please call us at [phone] if you have not heard about the biopsies in 3 weeks.    SIGNATURES/CONFIDENTIALITY: You and/or your care partner have signed paperwork which will be entered into your electronic medical record.  These signatures attest to the fact that that the information above on your After Visit Summary has been reviewed and is understood.  Full responsibility of the confidentiality of this discharge information lies with you and/or your care-partner. 

## 2018-09-07 NOTE — Progress Notes (Signed)
Report to PACU, RN, vss, BBS= Clear.  

## 2018-09-07 NOTE — Progress Notes (Signed)
Called to room to assist during endoscopic procedure.  Patient ID and intended procedure confirmed with present staff. Received instructions for my participation in the procedure from the performing physician.  

## 2018-09-07 NOTE — Op Note (Signed)
[facility] Patient Name: Nathan [name] Procedure Date: 09/07/2018 8:48 AM MRN: [id number] Endoscopist: Docia Chuck. Henrene Pastor , MD Age: 58 Referring MD:  Date of Birth: [birth year] Gender: Male Account #: [id number] Procedure:                Colonoscopy cold snare polypectomy x 3 Indications:              Screening for colorectal malignant neoplasm Medicines:                Monitored Anesthesia Care Procedure:                Pre-Anesthesia Assessment:                           - Prior to the procedure, a History and Physical                            was performed, and patient medications and                            allergies were reviewed. The patient's tolerance of                            previous anesthesia was also reviewed. The risks                            and benefits of the procedure and the sedation                            options and risks were discussed with the patient.                            All questions were answered, and informed consent                            was obtained. Prior Anticoagulants: The patient has                            taken no previous anticoagulant or antiplatelet                            agents. ASA Grade Assessment: II - A patient with                            mild systemic disease. After reviewing the risks                            and benefits, the patient was deemed in                            satisfactory condition to undergo the procedure.                           After obtaining informed consent, the colonoscope  was passed under direct vision. Throughout the                            procedure, the patient's blood pressure, pulse, and                            oxygen saturations were monitored continuously. The                            Colonoscope was introduced through the anus and                            advanced to the the cecum, identified by   appendiceal orifice and ileocecal valve. The                            ileocecal valve, appendiceal orifice, and rectum                            were photographed. The quality of the bowel                            preparation was excellent. The colonoscopy was                            performed without difficulty. The patient tolerated                            the procedure well. The bowel preparation used was                            SUPREP via split dose instruction. Scope In: 9:03:40 AM Scope Out: 9:18:55 AM Scope Withdrawal Time: 0 hours 11 minutes 15 seconds  Total Procedure Duration: 0 hours 15 minutes 15 seconds  Findings:                 Three polyps were found in the sigmoid colon and                            ascending colon. The polyps were 3 to 5 mm in size.                           Multiple diverticula were found in the left colon.                           Hemorrhoids were found during retroflexion.                           The exam was otherwise without abnormality on                            direct and retroflexion views. Complications:            No immediate complications. Estimated blood loss:  None. Estimated Blood Loss:     Estimated blood loss: none. Impression:               - Three 3 to 5 mm polyps in the sigmoid colon and                            in the ascending colon.                           - Diverticulosis in the left colon.                           - Hemorrhoids.                           - The examination was otherwise normal on direct                            and retroflexion views.                           - No specimens collected. Recommendation:           - Repeat colonoscopy in 3 - 5 years for                            surveillance.                           - Patient has a contact number available for                            emergencies. The signs and symptoms of potential                             delayed complications were discussed with the                            patient. Return to normal activities tomorrow.                            Written discharge instructions were provided to the                            patient.                           - Resume previous diet.                           - Continue present medications.                           - Await pathology results. Docia Chuck. Henrene Pastor, MD 09/07/2018 9:31:01 AM This report has been signed electronically.

## 2018-09-07 NOTE — Progress Notes (Signed)
Pt's states no medical or surgical changes since previsit or office visit. 

## 2018-09-09 ENCOUNTER — Telehealth: Payer: Self-pay

## 2018-09-09 ENCOUNTER — Encounter: Payer: Self-pay | Admitting: Internal Medicine

## 2018-09-09 NOTE — Telephone Encounter (Signed)
  Follow up Call-  Call back number 09/07/2018  Post procedure Call Back phone  # [phone]  Permission to leave phone message Yes  Some recent data might be hidden     Patient questions:  Do you have a fever, pain , or abdominal swelling? No. Pain Score  0 *  Have you tolerated food without any problems? Yes.    Have you been able to return to your normal activities? Yes.    Do you have any questions about your discharge instructions: Diet   No. Medications  No. Follow up visit  No.  Do you have questions or concerns about your Care? No.  Actions: * If pain score is 4 or above: No action needed, pain <4.  1. Have you developed a fever since your procedure? no  2.   Have you had an respiratory symptoms (SOB or cough) since your procedure? no  3.   Have you tested positive for COVID 19 since your procedure no  4.   Have you had any family members/close contacts diagnosed with the COVID 19 since your procedure?  no   If yes to any of these questions please route to Joylene John, RN and Alphonsa Gin, Therapist, sports.

## 2018-09-23 ENCOUNTER — Other Ambulatory Visit: Payer: Self-pay

## 2018-09-23 ENCOUNTER — Ambulatory Visit (INDEPENDENT_AMBULATORY_CARE_PROVIDER_SITE_OTHER): Payer: 59 | Admitting: Cardiology

## 2018-09-23 ENCOUNTER — Encounter: Payer: Self-pay | Admitting: Cardiology

## 2018-09-23 VITALS — BP 116/64 | HR 54 | Ht 72.0 in | Wt 243.1 lb

## 2018-09-23 DIAGNOSIS — I422 Other hypertrophic cardiomyopathy: Secondary | ICD-10-CM

## 2018-09-23 DIAGNOSIS — Z95810 Presence of automatic (implantable) cardiac defibrillator: Secondary | ICD-10-CM | POA: Diagnosis not present

## 2018-09-23 DIAGNOSIS — I472 Ventricular tachycardia, unspecified: Secondary | ICD-10-CM

## 2018-09-23 NOTE — Addendum Note (Signed)
Addended by: Polly Cobia A on: 09/23/2018 09:24 AM   Modules accepted: Orders
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
## 2018-10-05 ENCOUNTER — Ambulatory Visit (HOSPITAL_BASED_OUTPATIENT_CLINIC_OR_DEPARTMENT_OTHER)
Admission: RE | Admit: 2018-10-05 | Discharge: 2018-10-05 | Disposition: A | Discharge status: Home / Self Care | Payer: 59 | Source: Ambulatory Visit | Attending: Cardiology | Admitting: Cardiology

## 2018-10-05 ENCOUNTER — Other Ambulatory Visit: Payer: Self-pay

## 2018-10-05 DIAGNOSIS — I422 Other hypertrophic cardiomyopathy: Secondary | ICD-10-CM | POA: Diagnosis not present

## 2018-10-05 MED ORDER — PERFLUTREN LIPID MICROSPHERE
1.0000 mL | INTRAVENOUS | Status: AC | PRN
  Administered 2018-10-05: 3 mL via INTRAVENOUS
  Filled 2018-10-05: qty 10

## 2018-10-05 NOTE — Progress Notes (Signed)
  Echocardiogram 2D Echocardiogram has been performed.  Nathan [name] 10/05/2018, 9:38 AM

## 2018-10-11 ENCOUNTER — Ambulatory Visit (INDEPENDENT_AMBULATORY_CARE_PROVIDER_SITE_OTHER): Payer: 59 | Admitting: *Deleted

## 2018-10-11 DIAGNOSIS — I422 Other hypertrophic cardiomyopathy: Secondary | ICD-10-CM | POA: Diagnosis not present

## 2018-10-11 LAB — CUP PACEART REMOTE DEVICE CHECK
Battery Remaining Longevity: 54 mo
Battery Remaining Percentage: 53 %
Battery Voltage: 2.95 V
Brady Statistic AP VP Percent: 1 %
Brady Statistic AP VS Percent: 23 %
Brady Statistic AS VP Percent: 1 %
Brady Statistic AS VS Percent: 77 %
Brady Statistic RA Percent Paced: 21 %
Brady Statistic RV Percent Paced: 1 %
Date Time Interrogation Session: 20200728085234
HighPow Impedance: 46 Ohm
HighPow Impedance: 46 Ohm
Implantable Lead Implant Date: 20150910
Implantable Lead Implant Date: 20150910
Implantable Lead Location: 753859
Implantable Lead Location: 753860
Implantable Pulse Generator Implant Date: 20150910
Lead Channel Impedance Value: 350 Ohm
Lead Channel Impedance Value: 550 Ohm
Lead Channel Pacing Threshold Amplitude: 0.75 V
Lead Channel Pacing Threshold Amplitude: 1 V
Lead Channel Pacing Threshold Pulse Width: 0.5 ms
Lead Channel Pacing Threshold Pulse Width: 0.5 ms
Lead Channel Sensing Intrinsic Amplitude: 3.9 mV
Lead Channel Sensing Intrinsic Amplitude: 7.7 mV
Lead Channel Setting Pacing Amplitude: 2 V
Lead Channel Setting Pacing Amplitude: 2.5 V
Lead Channel Setting Pacing Pulse Width: 0.5 ms
Lead Channel Setting Sensing Sensitivity: 0.5 mV
Pulse Gen Serial Number: 7208008

## 2018-10-24 NOTE — Progress Notes (Signed)
Remote ICD transmission.   

## 2018-11-18 ENCOUNTER — Other Ambulatory Visit: Payer: Self-pay

## 2018-11-18 MED ORDER — METOPROLOL SUCCINATE ER 100 MG PO TB24: ORAL_TABLET | ORAL | 2 refills | Status: DC

## 2018-11-18 MED ORDER — ROSUVASTATIN CALCIUM 10 MG PO TABS: 10.0000 mg | ORAL_TABLET | Freq: Every day | ORAL | 2 refills | Status: DC

## 2019-01-08 ENCOUNTER — Other Ambulatory Visit: Payer: Self-pay | Admitting: Cardiology

## 2019-01-10 ENCOUNTER — Ambulatory Visit (INDEPENDENT_AMBULATORY_CARE_PROVIDER_SITE_OTHER): Payer: 59 | Admitting: *Deleted

## 2019-01-10 DIAGNOSIS — I422 Other hypertrophic cardiomyopathy: Secondary | ICD-10-CM

## 2019-01-10 DIAGNOSIS — I472 Ventricular tachycardia, unspecified: Secondary | ICD-10-CM

## 2019-01-10 LAB — CUP PACEART REMOTE DEVICE CHECK
Battery Remaining Longevity: 52 mo
Battery Remaining Percentage: 50 %
Battery Voltage: 2.93 V
Brady Statistic AP VP Percent: 1 %
Brady Statistic AP VS Percent: 24 %
Brady Statistic AS VP Percent: 1 %
Brady Statistic AS VS Percent: 75 %
Brady Statistic RA Percent Paced: 23 %
Brady Statistic RV Percent Paced: 1 %
Date Time Interrogation Session: 20201027060029
HighPow Impedance: 46 Ohm
HighPow Impedance: 46 Ohm
Implantable Lead Implant Date: 20150910
Implantable Lead Implant Date: 20150910
Implantable Lead Location: 753859
Implantable Lead Location: 753860
Implantable Pulse Generator Implant Date: 20150910
Lead Channel Impedance Value: 390 Ohm
Lead Channel Impedance Value: 530 Ohm
Lead Channel Pacing Threshold Amplitude: 0.75 V
Lead Channel Pacing Threshold Amplitude: 1 V
Lead Channel Pacing Threshold Pulse Width: 0.5 ms
Lead Channel Pacing Threshold Pulse Width: 0.5 ms
Lead Channel Sensing Intrinsic Amplitude: 2.9 mV
Lead Channel Sensing Intrinsic Amplitude: 6.8 mV
Lead Channel Setting Pacing Amplitude: 2 V
Lead Channel Setting Pacing Amplitude: 2.5 V
Lead Channel Setting Pacing Pulse Width: 0.5 ms
Lead Channel Setting Sensing Sensitivity: 0.5 mV
Pulse Gen Serial Number: 7208008

## 2019-01-20 ENCOUNTER — Telehealth: Payer: Self-pay | Admitting: Family

## 2019-01-20 ENCOUNTER — Telehealth: Payer: Self-pay | Admitting: Cardiology

## 2019-01-20 NOTE — Telephone Encounter (Signed)
Nathan [name] reviewed transmission and informed us that there were no arrhythmias and his report good. Dr. Agustin Cree aware of this and has no further recommendations for the patient at this time. I spoke with the patient informed him of this and encouraged him to call us if something changes or gets worse. He verbally understood, no further questions.

## 2019-01-20 NOTE — Telephone Encounter (Signed)
Please see previous encounter

## 2019-01-20 NOTE — Telephone Encounter (Signed)
Called patient. He reports that while sitting in his car he has had 2 instances of "severe" dizziness. He denied shortness of breath, or chest pain. He feels fine now however he wanted to know if we could do a transmission from his pacemaker to see if anything was going on at the time to cause this feeling. Dr. Agustin Cree aware. Contacted [employer] and verified we can do transmission. Patient will call us after 2 week gets home to sent the transmission and I will reach back out to reps to see what it showed. Patient advised if symptoms returned abruptly or got worse to go to the emergency department. Patient verbally understood, no further questions.

## 2019-01-20 NOTE — Telephone Encounter (Signed)
Left message for Gaspar Bidding, [employer] rep to return call. Patient called in and has uploaded a report to their monitor at home and Gaspar Bidding will need to look at this for [name].

## 2019-01-20 NOTE — Telephone Encounter (Signed)
New Message:  STAT if patient feels like he/she is going to faint   1) Are you dizzy now? no  2) Do you feel faint or have you passed out? Yes. He almost passed out.   3) Do you have any other symptoms? Nausea, clammy and sweaty  4) Have you checked your HR and BP (record if available)? No   Pt had two good waves of dizziness. He could not notice an increase in his heart rate or bp. He was out in the field on the phone when the spells occurred.  The symptoms have resolved themself. He has a pacemaker, and wasn't sure if the office was able to see the change in his heart rate or not. He really  just wanted to  know what he should do if these spells occur again.

## 2019-01-20 NOTE — Telephone Encounter (Signed)
Patient called to let you know that he uploaded his heart monitor today

## 2019-02-01 NOTE — Progress Notes (Signed)
Remote ICD transmission.   

## 2019-02-09 ENCOUNTER — Emergency Department (HOSPITAL_COMMUNITY)
Admission: EM | Admit: 2019-02-09 | Discharge: 2019-02-09 | Disposition: A | Discharge status: Home / Self Care | Payer: 59 | Attending: Emergency Medicine | Admitting: Emergency Medicine

## 2019-02-09 ENCOUNTER — Emergency Department (HOSPITAL_COMMUNITY): Payer: 59

## 2019-02-09 DIAGNOSIS — Z79899 Other long term (current) drug therapy: Secondary | ICD-10-CM | POA: Diagnosis not present

## 2019-02-09 DIAGNOSIS — Z95811 Presence of heart assist device: Secondary | ICD-10-CM | POA: Insufficient documentation

## 2019-02-09 DIAGNOSIS — I10 Essential (primary) hypertension: Secondary | ICD-10-CM | POA: Insufficient documentation

## 2019-02-09 DIAGNOSIS — R0789 Other chest pain: Secondary | ICD-10-CM

## 2019-02-09 LAB — CBC
HCT: 47.2 % (ref 39.0–52.0)
Hemoglobin: 16.3 g/dL (ref 13.0–17.0)
MCH: 31.7 pg (ref 26.0–34.0)
MCHC: 34.5 g/dL (ref 30.0–36.0)
MCV: 91.7 fL (ref 80.0–100.0)
Platelets: 225 10*3/uL (ref 150–400)
RBC: 5.15 MIL/uL (ref 4.22–5.81)
RDW: 12.5 % (ref 11.5–15.5)
WBC: 6.9 10*3/uL (ref 4.0–10.5)
nRBC: 0 % (ref 0.0–0.2)

## 2019-02-09 LAB — BASIC METABOLIC PANEL
Anion gap: 8 (ref 5–15)
BUN: 14 mg/dL (ref 6–20)
CO2: 25 mmol/L (ref 22–32)
Calcium: 9.2 mg/dL (ref 8.9–10.3)
Chloride: 104 mmol/L (ref 98–111)
Creatinine, Ser: 1.22 mg/dL (ref 0.61–1.24)
GFR calc Af Amer: >60 mL/min (ref >60)
GFR calc non Af Amer: >60 mL/min (ref >60)
Glucose, Bld: 180 mg/dL — ABNORMAL HIGH (ref 70–99)
Potassium: 4.5 mmol/L (ref 3.5–5.1)
Sodium: 137 mmol/L (ref 135–145)

## 2019-02-09 LAB — TROPONIN I (HIGH SENSITIVITY)
Troponin I (High Sensitivity): 18 ng/L — ABNORMAL HIGH (ref <18)
Troponin I (High Sensitivity): 15 ng/L (ref <18)

## 2019-02-09 MED ORDER — SODIUM CHLORIDE 0.9% FLUSH: 3.0000 mL | Freq: Once | INTRAVENOUS | Status: DC

## 2019-02-09 MED ORDER — PREDNISONE 20 MG PO TABS: 40.0000 mg | ORAL_TABLET | Freq: Every day | ORAL | 0 refills | Status: DC

## 2019-02-09 MED ORDER — KETOROLAC TROMETHAMINE 15 MG/ML IJ SOLN
15.0000 mg | Freq: Once | INTRAMUSCULAR | Status: AC
  Administered 2019-02-09: 15 mg via INTRAVENOUS
  Filled 2019-02-09: qty 1

## 2019-02-09 NOTE — Discharge Instructions (Signed)
As discussed, your evaluation today has been largely reassuring.  But, it is important that you monitor your condition carefully, and do not hesitate to return to the ED if you develop new, or concerning changes in your condition. ? ?Otherwise, please follow-up with your physician for appropriate ongoing care. ? ?

## 2019-02-09 NOTE — ED Triage Notes (Addendum)
Pt states he felt a dull ache in his chest yesterday but was a pain he has felt before in his chest however this morning after waking up he got an intense pain in the left side of his chest that goes and goes and feels like a tightness. Currently at a 5/10. Pt states he had ems come out to house had an ekg done and was given 324 mg asa by ems- pt decided to drive myself to ER.
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
## 2019-02-09 NOTE — ED Notes (Signed)
Attempted to interrogate pacemaker, device unable to transmit at this time. This RN spoke with [facility] representative, Jarrett Soho. Received transmission sent by by patient at approx. 0700 this am via email. Email with transmission forwarded to ED MD, Dr. Vanita Panda.

## 2019-02-09 NOTE — ED Notes (Signed)
Patient transported to X-ray 

## 2019-02-13 ENCOUNTER — Telehealth: Payer: Self-pay | Admitting: Cardiology

## 2019-02-13 MED ORDER — NORPACE CR 100 MG PO CP12: 100.0000 mg | ORAL_CAPSULE | Freq: Two times a day (BID) | ORAL | 0 refills | Status: DC

## 2019-02-13 NOTE — Telephone Encounter (Signed)
[facility]

## 2019-02-13 NOTE — Addendum Note (Signed)
Addended by: Ashok Norris on: 02/13/2019 05:16 PM   Modules accepted: Orders

## 2019-02-13 NOTE — Telephone Encounter (Signed)
Patient called the [facility] office and just received a note from [facility] and is on back order and will not be available for so,e time.Marland Kitchen ?? The med is Norpace CR ans needs to know what to do.. please call him.

## 2019-02-14 NOTE — Addendum Note (Signed)
Addended by: Ashok Norris on: 02/14/2019 04:32 PM   Modules accepted: Orders

## 2019-02-14 NOTE — Telephone Encounter (Signed)
Patient called back. [facility] not [facility] is on [facility] it [facility] order [facility]. Per Dr. Agustin Cree patient needs appointment to assess EKG before adjusting to short acting norpace. Patient verbally understands and scheduled for appt.

## 2019-02-16 ENCOUNTER — Ambulatory Visit (INDEPENDENT_AMBULATORY_CARE_PROVIDER_SITE_OTHER): Payer: 59 | Admitting: Cardiology

## 2019-02-16 ENCOUNTER — Other Ambulatory Visit: Payer: Self-pay

## 2019-02-16 ENCOUNTER — Encounter: Payer: Self-pay | Admitting: Cardiology

## 2019-02-16 VITALS — BP 124/66 | HR 53 | Wt 245.0 lb

## 2019-02-16 DIAGNOSIS — I472 Ventricular tachycardia, unspecified: Secondary | ICD-10-CM

## 2019-02-16 DIAGNOSIS — I422 Other hypertrophic cardiomyopathy: Secondary | ICD-10-CM | POA: Diagnosis not present

## 2019-02-16 DIAGNOSIS — Z95810 Presence of automatic (implantable) cardiac defibrillator: Secondary | ICD-10-CM | POA: Diagnosis not present

## 2019-02-16 NOTE — Patient Instructions (Signed)
Medication Instructions:  Your physician recommends that you continue on your current medications as directed. Please refer to the Current Medication list given to you today.  We will update you with a medication change after hearing from Dr. Rayann Heman   *If you need a refill on your cardiac medications before your next appointment, please call your pharmacy*  Lab Work: None ordered  If you have labs (blood work) drawn today and your tests are completely normal, you will receive your results only by: Marland Kitchen MyChart Message (if you have MyChart) OR . A paper copy in the mail If you have any lab test that is abnormal or we need to change your treatment, we will call you to review the results.  Testing/Procedures: None ordered  Follow-Up: At [facility], you and your health needs are our priority.  As part of our continuing mission to provide you with exceptional heart care, we have created designated Provider Care Teams.  These Care Teams include your primary Cardiologist (physician) and Advanced Practice Providers (APPs -  Physician Assistants and Nurse Practitioners) who all work together to provide you with the care you need, when you need it.  Your next appointment:   Keep your current follow up  The format for your next appointment:   In Person  Provider:   You will see  Jenne Campus.  Or, you can be scheduled with the following Advanced Practice Provider on your designated Care Team (at our [facility]):  Laurann Montana, FNP
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
## 2019-02-27 ENCOUNTER — Telehealth: Payer: Self-pay | Admitting: Cardiology

## 2019-02-27 NOTE — Telephone Encounter (Signed)
*  STAT* If patient is at the pharmacy, call can be transferred to refill team.   1. Which medications need to be refilled? (please list name of each medication and dose if known) disopyramide (NORPACE CR) 100 MG 12 hr capsule  2. Which pharmacy/location (including street and city if local pharmacy) is medication to be sent to? [place], [place]  3. Do they need a 30 day or 90 day supply? 90 day

## 2019-02-27 NOTE — Telephone Encounter (Signed)
Is it okay to refill Norpace CR 100 mg? Please advise. Thanks!

## 2019-02-28 NOTE — Telephone Encounter (Signed)
Yes it is okay to refill Norpace CR 100 mg twice daily

## 2019-03-01 MED ORDER — DISOPYRAMIDE PHOSPHATE ER 100 MG PO CP12: 100.0000 mg | ORAL_CAPSULE | Freq: Two times a day (BID) | ORAL | 0 refills | Status: DC

## 2019-03-01 NOTE — Telephone Encounter (Signed)
Refill sent as requested. 

## 2019-03-07 ENCOUNTER — Other Ambulatory Visit: Payer: Self-pay

## 2019-03-07 ENCOUNTER — Other Ambulatory Visit: Payer: Self-pay | Admitting: Cardiology

## 2019-03-07 MED ORDER — DISOPYRAMIDE PHOSPHATE ER 100 MG PO CP12: 100.0000 mg | ORAL_CAPSULE | Freq: Two times a day (BID) | ORAL | 0 refills | Status: DC

## 2019-03-07 NOTE — Telephone Encounter (Signed)
Refill sent to [place] per patient's request.

## 2019-03-07 NOTE — Progress Notes (Signed)
Refill sent to [facility] per patients request.

## 2019-03-07 NOTE — Telephone Encounter (Signed)
New Message      *STAT* If patient is at the pharmacy, call can be transferred to refill team.   1. Which medications need to be refilled? (please list name of each medication and dose if known) disopyramide (NORPACE CR) 100 MG 12 hr capsule  2. Which pharmacy/location (including street and city if local pharmacy) is medication to be sent to? [place], [place]  3. Do they need a 30 day or 90 day supply? 90 day    Pt will be out of medication in 3 days, [facility] says [facility] are [facility] of Norpace CR and the prescription has to be for just Norpace.  He also states he will not receive his mail order in time and will need a supply sent too  [id number] [place] [place] [facility] [address]  Pt would like to get medication today

## 2019-03-08 ENCOUNTER — Telehealth: Payer: Self-pay | Admitting: Cardiology

## 2019-03-08 ENCOUNTER — Other Ambulatory Visit: Payer: Self-pay | Admitting: Emergency Medicine

## 2019-03-08 MED ORDER — DISOPYRAMIDE PHOSPHATE 100 MG PO CAPS: 100.0000 mg | ORAL_CAPSULE | Freq: Three times a day (TID) | ORAL | 1 refills | Status: DC

## 2019-03-08 MED ORDER — DISOPYRAMIDE PHOSPHATE 100 MG PO CAPS: 100.0000 mg | ORAL_CAPSULE | Freq: Three times a day (TID) | ORAL | 0 refills | Status: DC

## 2019-03-08 NOTE — Telephone Encounter (Signed)
Please see additional encounter.

## 2019-03-08 NOTE — Telephone Encounter (Signed)
[facility] on [facility]

## 2019-03-08 NOTE — Telephone Encounter (Signed)
Follow up    [facility] is [facility] [place]. He says the pharmacy says they need to have the medication written without "CR"  He says he is needing to have [facility] because he is almost out of medication  He is wondering if the office has any samples incase the pharmacy still wont have the medication he is needing     Please call

## 2019-03-08 NOTE — Telephone Encounter (Signed)
New Message    Pt is calling and says he had [place] and today was his last for [place]. He is wondering if it is okay for him to come for his appt on Monday   Please call back

## 2019-03-08 NOTE — Telephone Encounter (Signed)
Patient called back. [facility] in case [facility] to [facility]. [facility] and apparently it has already shipped. He will call tomorrow before 12 if he hasn't got it.

## 2019-03-08 NOTE — Telephone Encounter (Signed)
Patient called back. Mainly wanting to know about getting his norpace refilled. He currently takes norpace 100 mg twice daily the extended release however it is out of stock every where. Per Dr. Agustin Cree the patient can be switched to Norpace (regular) 100 mg every 8 hours. The patient has already called around and [facility] has this medication and can over night it if we call. I called spoke with [facility], pharmacists who said he put a note to overnight but they can't guarantee due to issues with [employer] from covid and holidays with deliveries. [facility] [address] [facility] as they can possibly get from their warehouse. Patient advised to call us if they do not receive. Patient verbally understands.    Per Dr. Agustin Cree since patient was recently diagnosed with [place] appointment on Monday will be virtual patient aware. No further questions.

## 2019-03-13 ENCOUNTER — Encounter: Payer: Self-pay | Admitting: Cardiology

## 2019-03-13 ENCOUNTER — Other Ambulatory Visit: Payer: Self-pay

## 2019-03-13 ENCOUNTER — Telehealth (INDEPENDENT_AMBULATORY_CARE_PROVIDER_SITE_OTHER): Payer: 59 | Admitting: Cardiology

## 2019-03-13 VITALS — BP 136/76 | HR 57 | Ht 72.0 in | Wt 242.0 lb

## 2019-03-13 DIAGNOSIS — I422 Other hypertrophic cardiomyopathy: Secondary | ICD-10-CM | POA: Diagnosis not present

## 2019-03-13 DIAGNOSIS — I472 Ventricular tachycardia, unspecified: Secondary | ICD-10-CM

## 2019-03-13 DIAGNOSIS — E785 Hyperlipidemia, unspecified: Secondary | ICD-10-CM
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
## 2019-04-11 ENCOUNTER — Ambulatory Visit (INDEPENDENT_AMBULATORY_CARE_PROVIDER_SITE_OTHER): Payer: 59 | Admitting: *Deleted

## 2019-04-11 DIAGNOSIS — I472 Ventricular tachycardia, unspecified: Secondary | ICD-10-CM

## 2019-04-12 LAB — CUP PACEART REMOTE DEVICE CHECK
Battery Remaining Longevity: 49 mo
Battery Remaining Percentage: 48 %
Battery Voltage: 2.93 V
Brady Statistic AP VP Percent: 1 %
Brady Statistic AP VS Percent: 24 %
Brady Statistic AS VP Percent: 1 %
Brady Statistic AS VS Percent: 75 %
Brady Statistic RA Percent Paced: 23 %
Brady Statistic RV Percent Paced: 1 %
Date Time Interrogation Session: 20210126020017
HighPow Impedance: 46 Ohm
HighPow Impedance: 46 Ohm
Implantable Lead Implant Date: 20150910
Implantable Lead Implant Date: 20150910
Implantable Lead Location: 753859
Implantable Lead Location: 753860
Implantable Pulse Generator Implant Date: 20150910
Lead Channel Impedance Value: 400 Ohm
Lead Channel Impedance Value: 510 Ohm
Lead Channel Pacing Threshold Amplitude: 0.75 V
Lead Channel Pacing Threshold Amplitude: 1 V
Lead Channel Pacing Threshold Pulse Width: 0.5 ms
Lead Channel Pacing Threshold Pulse Width: 0.5 ms
Lead Channel Sensing Intrinsic Amplitude: 2.9 mV
Lead Channel Sensing Intrinsic Amplitude: 8.6 mV
Lead Channel Setting Pacing Amplitude: 2 V
Lead Channel Setting Pacing Amplitude: 2.5 V
Lead Channel Setting Pacing Pulse Width: 0.5 ms
Lead Channel Setting Sensing Sensitivity: 0.5 mV
Pulse Gen Serial Number: 7208008

## 2019-04-14 ENCOUNTER — Encounter: Payer: Self-pay | Admitting: Internal Medicine

## 2019-04-14 NOTE — Telephone Encounter (Signed)
ERROR
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
## 2019-06-14 ENCOUNTER — Encounter: Payer: Self-pay | Admitting: Student

## 2019-06-14 ENCOUNTER — Ambulatory Visit: Payer: 59 | Admitting: Student

## 2019-06-14 ENCOUNTER — Other Ambulatory Visit: Payer: Self-pay

## 2019-06-14 VITALS — BP 102/68 | HR 54 | Ht 72.0 in | Wt 246.6 lb

## 2019-06-14 DIAGNOSIS — I472 Ventricular tachycardia, unspecified: Secondary | ICD-10-CM

## 2019-06-14 DIAGNOSIS — I422 Other hypertrophic cardiomyopathy: Secondary | ICD-10-CM

## 2019-06-14 DIAGNOSIS — Z95810 Presence of automatic (implantable) cardiac defibrillator: Secondary | ICD-10-CM | POA: Diagnosis not present

## 2019-06-14 DIAGNOSIS — E785 Hyperlipidemia, unspecified: Secondary | ICD-10-CM

## 2019-06-14 LAB — CUP PACEART INCLINIC DEVICE CHECK
Battery Remaining Longevity: 51 mo
Brady Statistic RA Percent Paced: 22 %
Brady Statistic RV Percent Paced: 0.07 %
Date Time Interrogation Session: 20210331123444
HighPow Impedance: 50.0394
Implantable Lead Implant Date: 20150910
Implantable Lead Implant Date: 20150910
Implantable Lead Location: 753859
Implantable Lead Location: 753860
Implantable Pulse Generator Implant Date: 20150910
Lead Channel Impedance Value: 387.5 Ohm
Lead Channel Impedance Value: 475 Ohm
Lead Channel Pacing Threshold Amplitude: 0.5 V
Lead Channel Pacing Threshold Amplitude: 0.5 V
Lead Channel Pacing Threshold Amplitude: 1 V
Lead Channel Pacing Threshold Amplitude: 1 V
Lead Channel Pacing Threshold Pulse Width: 0.5 ms
Lead Channel Pacing Threshold Pulse Width: 0.5 ms
Lead Channel Pacing Threshold Pulse Width: 0.5 ms
Lead Channel Pacing Threshold Pulse Width: 0.5 ms
Lead Channel Sensing Intrinsic Amplitude: 3.4 mV
Lead Channel Sensing Intrinsic Amplitude: 6.6 mV
Lead Channel Setting Pacing Amplitude: 2 V
Lead Channel Setting Pacing Amplitude: 2.5 V
Lead Channel Setting Pacing Pulse Width: 0.5 ms
Lead Channel Setting Sensing Sensitivity: 0.5 mV
Pulse Gen Serial Number: 7208008

## 2019-06-28 ENCOUNTER — Other Ambulatory Visit: Payer: Self-pay | Admitting: Cardiology

## 2019-07-11 ENCOUNTER — Ambulatory Visit (INDEPENDENT_AMBULATORY_CARE_PROVIDER_SITE_OTHER): Payer: 59 | Admitting: *Deleted

## 2019-07-11 DIAGNOSIS — I472 Ventricular tachycardia, unspecified: Secondary | ICD-10-CM

## 2019-07-11 LAB — CUP PACEART REMOTE DEVICE CHECK
Battery Remaining Longevity: 47 mo
Battery Remaining Percentage: 46 %
Battery Voltage: 2.93 V
Brady Statistic AP VP Percent: 1 %
Brady Statistic AP VS Percent: 21 %
Brady Statistic AS VP Percent: 1 %
Brady Statistic AS VS Percent: 78 %
Brady Statistic RA Percent Paced: 20 %
Brady Statistic RV Percent Paced: 1 %
Date Time Interrogation Session: 20210427020015
HighPow Impedance: 47 Ohm
HighPow Impedance: 47 Ohm
Implantable Lead Implant Date: 20150910
Implantable Lead Implant Date: 20150910
Implantable Lead Location: 753859
Implantable Lead Location: 753860
Implantable Pulse Generator Implant Date: 20150910
Lead Channel Impedance Value: 390 Ohm
Lead Channel Impedance Value: 450 Ohm
Lead Channel Pacing Threshold Amplitude: 0.5 V
Lead Channel Pacing Threshold Amplitude: 1 V
Lead Channel Pacing Threshold Pulse Width: 0.5 ms
Lead Channel Pacing Threshold Pulse Width: 0.5 ms
Lead Channel Sensing Intrinsic Amplitude: 3.1 mV
Lead Channel Sensing Intrinsic Amplitude: 7.2 mV
Lead Channel Setting Pacing Amplitude: 2 V
Lead Channel Setting Pacing Amplitude: 2.5 V
Lead Channel Setting Pacing Pulse Width: 0.5 ms
Lead Channel Setting Sensing Sensitivity: 0.5 mV
Pulse Gen Serial Number: 7208008

## 2019-07-12 NOTE — Progress Notes (Signed)
ICD Remote  

## 2019-07-15 DIAGNOSIS — E119 Type 2 diabetes mellitus without complications: Secondary | ICD-10-CM

## 2019-07-15 HISTORY — DX: Type 2 diabetes mellitus without complications: E11.9

## 2019-07-18 ENCOUNTER — Other Ambulatory Visit: Payer: Self-pay | Admitting: Cardiology

## 2019-07-24 ENCOUNTER — Other Ambulatory Visit: Payer: Self-pay

## 2019-07-24 ENCOUNTER — Encounter: Payer: Self-pay | Admitting: Medical

## 2019-07-24 ENCOUNTER — Ambulatory Visit (INDEPENDENT_AMBULATORY_CARE_PROVIDER_SITE_OTHER): Payer: 59 | Admitting: Medical

## 2019-07-24 VITALS — BP 118/72 | HR 51 | Temp 98.8°F | Ht 72.0 in | Wt 241.4 lb

## 2019-07-24 DIAGNOSIS — I472 Ventricular tachycardia, unspecified: Secondary | ICD-10-CM

## 2019-07-24 DIAGNOSIS — Z7189 Other specified counseling: Secondary | ICD-10-CM

## 2019-07-24 DIAGNOSIS — E785 Hyperlipidemia, unspecified: Secondary | ICD-10-CM

## 2019-07-24 DIAGNOSIS — Z0000 Encounter for general adult medical examination without abnormal findings: Secondary | ICD-10-CM | POA: Diagnosis not present

## 2019-07-24 DIAGNOSIS — Z125 Encounter for screening for malignant neoplasm of prostate: Secondary | ICD-10-CM

## 2019-07-24 DIAGNOSIS — E669 Obesity, unspecified: Secondary | ICD-10-CM

## 2019-07-24 DIAGNOSIS — Z1159 Encounter for screening for other viral diseases: Secondary | ICD-10-CM

## 2019-07-24 DIAGNOSIS — I422 Other hypertrophic cardiomyopathy: Secondary | ICD-10-CM

## 2019-07-24 DIAGNOSIS — Z7185 Encounter for immunization safety counseling: Secondary | ICD-10-CM

## 2019-07-24 DIAGNOSIS — Z9889 Other specified postprocedural states: Secondary | ICD-10-CM | POA: Insufficient documentation

## 2019-07-24 DIAGNOSIS — L729 Follicular cyst of the skin and subcutaneous tissue, unspecified: Secondary | ICD-10-CM

## 2019-07-24 DIAGNOSIS — Z95810 Presence of automatic (implantable) cardiac defibrillator: Secondary | ICD-10-CM

## 2019-07-24 DIAGNOSIS — D1723 Benign lipomatous neoplasm of skin and subcutaneous tissue of right leg: Secondary | ICD-10-CM

## 2019-07-24 HISTORY — DX: Encounter for immunization safety counseling: Z71.85

## 2019-07-24 HISTORY — DX: Follicular cyst of the skin and subcutaneous tissue, unspecified: L72.9

## 2019-07-24 HISTORY — DX: Benign lipomatous neoplasm of skin and subcutaneous tissue of right leg: D17.23

## 2019-07-24 HISTORY — DX: Other specified postprocedural states: Z98.890

## 2019-07-24 HISTORY — DX: Encounter for screening for other viral diseases: Z11.59
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
## 2019-07-24 NOTE — Patient Instructions (Signed)
Preventative Care for Adults - Male    Thank you for coming in for your well visit today, and thank you for trusting [name] with your care!   Maintain regular health and wellness exams:  A routine yearly physical is a good way to check in with your primary care provider about your health and preventive screening. It is also an opportunity to share updates about your health and any concerns you have, and receive a thorough all-over exam.   Most health insurance companies pay for at least some preventative services.  Check with your health plan for specific coverages.  What preventative services do men need?  Adult men should have their weight and blood pressure checked regularly.   Men age 61 and older should have their cholesterol levels checked regularly.  Beginning at age 24 and continuing to age 73, men should be screened for colorectal cancer.  Certain people may need continued testing until age 46.  Updating vaccinations is part of preventative care.  Vaccinations help protect against diseases such as the flu.  Osteoporosis is a disease in which the bones lose minerals and strength as we age. Men ages 27 and over should discuss this with their caregivers  Lab tests are generally done as part of preventative care to screen for anemia and blood disorders, to screen for problems with the kidneys and liver, to screen for bladder problems, to check blood sugar, and to check your cholesterol level.  Preventative services generally include counseling about diet, exercise, avoiding tobacco, drugs, excessive alcohol consumption, and sexually transmitted infections.   Xrays and CT scans are not normally done as a preventative test, and most insurances do not pay for imaging for screening other than as discussed under cancer screens below.   On the other hand, if you have certain medical concerns, imaging may be necessary as a diagnostic test.    Your Medical Team Your medical team starts with  [name], your PCP or primary care provider.  Please use our services for your routine care such as physicals, screenings, immunizations, sick visits, and your first stop for general medical concerns.  You can call our number for after hours information for urgent questions that may need attention but cannot wait til the next business day.    Urgent care-urgent cares exist to provide care when your primary care office would typically be closed such as evenings or weekends.   Urgent care is for evaluation of urgent medical problems that do not necessarily require emergency department care, but cannot wait til the next business day when we are open.  Emergency department care-please reserve emergency department care for serious, urgent, possibly life-threatening medical problems.  This includes issues like possible stroke, heart attack, significant injury, mental health crisis, or other urgent need that requires immediate medical attention.     See your dentist office twice yearly for hygiene and cleaning visits.   Brush your teeth and floss your teeth daily.  See your eye doctor yearly for routine eye exam and screenings for glaucoma and retinal disease.    Vaccines:  Stay up to date with your tetanus shots and other required immunizations. You should have a booster for tetanus every 10 years. Be sure to get your flu shot every year, since 5%-20% of the [place] population comes down with the flu. The flu vaccine changes each year, so being vaccinated once is not enough. Get your shot in the fall, before the flu season peaks.   Other vaccines to  consider:  Pneumococcal vaccine to protect against certain types of pneumonia.  This is normally recommended for adults age 61 or older.  However, adults younger than 61 years old with certain underlying conditions such as diabetes, heart or lung disease should also receive the vaccine.  Shingles vaccine to protect against Varicella Zoster if you are older than age  61, or younger than 61 years old with certain underlying illness.  If you have not had the Shingrix vaccine, please call your insurer to inquire about coverage for the Shingrix vaccine given in 2 doses.   Some insurers cover this vaccine after age 42, some cover this after age 63.  If your insurer covers this, then call to schedule appointment to have this vaccine here  Hepatitis A vaccine to protect against a form of infection of the liver by a virus acquired from food.  Hepatitis B vaccine to protect against a form of infection of the liver by a virus acquired from blood or body fluids, particularly if you work in health care.  If you plan to travel internationally, check with your local health department for specific vaccination recommendations.  Human Papilloma Virus or HPV causes cancer of the cervix, and other infections that can be transmitted from person to person. There is a vaccine for HPV, and males should get immunized between the ages of 46 and 29. It requires a series of 3 shots.   Covid/Coronavirus - as the vaccines are becoming available, please consider vaccination if you are a health care worker, first responder, or have significant health problems such as asthma, COPD, heart disease, hypertension, diabetes, obesity, multiple medical problems, over age 42yo, or immunocompromised.     I recommend the following vaccines: Shingles vaccine:  I recommend you have a shingles vaccine to help prevent shingles or herpes zoster outbreak.   Please call your insurer to inquire about coverage for the Shingrix vaccine given in 2 doses.   Some insurers cover this vaccine after age 57, some cover this after age 88.  If your insurer covers this, then call to schedule appointment to have this vaccine here.  Ask about Prevnar 13 pneumonia vaccine    What should I know about Cancer screening? Many types of cancers can be detected early and may often be prevented. Lung Cancer  You should be  screened every year for lung cancer if: ? You are a current smoker who has smoked for at least 30 years. ? You are a former smoker who has quit within the past 15 years.  Talk to your health care provider about your screening options, when you should start screening, and how often you should be screened.  Colorectal Cancer  Routine colorectal cancer screening usually begins at 61 years of age and should be repeated every 5-10 years until you are 61 years old. You may need to be screened more often if early forms of precancerous polyps or small growths are found. Your health care provider may recommend screening at an earlier age if you have risk factors for colon cancer.  Your health care provider may recommend using home test kits to check for hidden blood in the stool.  A small camera at the end of a tube can be used to examine your colon (sigmoidoscopy or colonoscopy). This checks for the earliest forms of colorectal cancer.  Prostate and Testicular Cancer  Depending on your age and overall health, your health care provider may do certain tests to screen for prostate and testicular  cancer.  Talk to your health care provider about any symptoms or concerns you have about testicular or prostate cancer.  Skin Cancer  Check your skin from head to toe regularly.  Tell your health care provider about any new moles or changes in moles, especially if: ? There is a change in a mole's size, shape, or color. ? You have a mole that is larger than a pencil eraser.  Always use sunscreen. Apply sunscreen liberally and repeat throughout the day.  Protect yourself by wearing long sleeves, pants, a wide-brimmed hat, and sunglasses when outside.     GENERAL RECOMMENDATIONS FOR GOOD HEALTH:  Healthy diet:  Eat a variety of foods, including fruit, vegetables, animal or vegetable protein, such as meat, fish, chicken, and eggs, or beans, lentils, tofu, and grains, such as rice.  Drink plenty of  water daily.  Decrease saturated fat in the diet, avoid lots of red meat, processed foods, sweets, fast foods, and fried foods.  Exercise:  Aerobic exercise helps maintain good heart health. At least 30-40 minutes of moderate-intensity exercise is recommended. For example, a brisk walk that increases your heart rate and breathing. This should be done on most days of the week.   Find a type of exercise or a variety of exercises that you enjoy so that it becomes a part of your daily life.  Examples are running, walking, swimming, water aerobics, and biking.  For motivation and support, explore group exercise such as aerobic class, spin class, Zumba, Yoga,or  martial arts, etc.    Set exercise goals for yourself, such as a certain weight goal, walk or run in a race such as a 5k walk/run.  Speak to your primary care provider about exercise goals.  Your weight readings per our records: Wt Readings from Last 3 Encounters:  07/24/19 241 lb 6.4 oz (109.5 kg)  06/14/19 246 lb 9.6 oz (111.9 kg)  03/13/19 242 lb (109.8 kg)    Body mass index is 32.74 kg/m.    Disease prevention:  If you smoke or chew tobacco, find out from your caregiver how to quit. It can literally save your life, no matter how long you have been a tobacco user. If you do not use tobacco, never begin.   Maintain a healthy diet and normal weight. Increased weight leads to problems with blood pressure and diabetes.   The Body Mass Index or BMI is a way of measuring how much of your body is fat. Having a BMI above 27 increases the risk of heart disease, diabetes, hypertension, stroke and other problems related to obesity. Your caregiver can help determine your BMI and based on it develop an exercise and dietary program to help you achieve or maintain this important measurement at a healthful level.  High blood pressure causes heart and blood vessel problems.  Persistent high blood pressure should be treated with medicine if  weight loss and exercise do not work.  Your blood pressure readings per our records:     BP Readings from Last 3 Encounters:  07/24/19 118/72  06/14/19 102/68  03/13/19 136/76     Fat and cholesterol leaves deposits in your arteries that can block them. This causes heart disease and vessel disease elsewhere in your body.  If your cholesterol is found to be high, or if you have heart disease or certain other medical conditions, then you may need to have your cholesterol monitored frequently and be treated with medication.   Osteoporosis is a disease in  which the bones lose minerals and strength as we age. This can result in serious bone fractures. Risk of osteoporosis can be identified using a bone density scan. Men ages 30 and over should discuss this with their caregivers. Ask your caregiver whether you should be taking a calcium supplement and Vitamin D, to reduce the rate of osteoporosis.   Avoid drinking alcohol in excess (more than two drinks per day).  Avoid use of street drugs. Do not share needles with anyone. Ask for professional help if you need assistance or instructions on stopping the use of alcohol, cigarettes, and/or drugs.  Brush your teeth twice a day with fluoride toothpaste, and floss once a day. Good oral hygiene prevents tooth decay and gum disease. The problems can be painful, unattractive, and can cause other health problems. Visit your dentist for a routine oral and dental check up and preventive care every 6-12 months.   Safety:  Use seatbelts 100% of the time, whether driving or as a passenger.  Use safety devices such as hearing protection if you work in environments with loud noise or significant background noise.  Use safety glasses when doing any work that could send debris in to the eyes.  Use a helmet if you ride a bike or motorcycle.  Use appropriate safety gear for contact sports.  Talk to your caregiver about gun safety.  Use sunscreen with a SPF (or skin  protection factor) of 15 or greater.  Lighter skinned people are at a greater risk of skin cancer. Don't forget to also wear sunglasses in order to protect your eyes from too much damaging sunlight. Damaging sunlight can accelerate cataract formation.   Keep carbon monoxide and smoke detectors in your home functioning at all times. Change the batteries every 6 months or use a model that plugs into the wall.    Sexual activity: . Sex is a normal part of life and sexual activity can continue into older adulthood for many healthy people.   . If you are having erectile dysfunction issues, please follow up to discuss this further.   . If you are not in a monogamous relationship or have more than one partner, please practice safe sex.  Use condoms. Condoms are used for birth control and to help reduce the spread of sexually transmitted infections (or STIs).  Some of the STIs are gonorrhea (the clap), chlamydia, syphilis, trichomonas, herpes, HPV (human papilloma virus) and HIV (human immunodeficiency virus) which causes AIDS. The herpes, HIV and HPV are viral illnesses that have no cure. These can result in disability, cancer and death.   We are able to test for STIs here at our office.

## 2019-07-25 LAB — COMPREHENSIVE METABOLIC PANEL
ALT: 37 IU/L (ref 0–44)
AST: 32 IU/L (ref 0–40)
Albumin/Globulin Ratio: 1.7 (ref 1.2–2.2)
Albumin: 4.6 g/dL (ref 3.8–4.9)
Alkaline Phosphatase: 57 IU/L (ref 39–117)
BUN/Creatinine Ratio: 12 (ref 10–24)
BUN: 14 mg/dL (ref 8–27)
Bilirubin Total: 0.6 mg/dL (ref 0.0–1.2)
CO2: 20 mmol/L (ref 20–29)
Calcium: 9.5 mg/dL (ref 8.6–10.2)
Chloride: 103 mmol/L (ref 96–106)
Creatinine, Ser: 1.18 mg/dL (ref 0.76–1.27)
GFR calc Af Amer: 77 mL/min/{1.73_m2} (ref >59)
GFR calc non Af Amer: 67 mL/min/{1.73_m2} (ref >59)
Globulin, Total: 2.7 g/dL (ref 1.5–4.5)
Glucose: 188 mg/dL — ABNORMAL HIGH (ref 65–99)
Potassium: 4.5 mmol/L (ref 3.5–5.2)
Sodium: 136 mmol/L (ref 134–144)
Total Protein: 7.3 g/dL (ref 6.0–8.5)

## 2019-07-25 LAB — CBC WITH DIFFERENTIAL/PLATELET
Basophils Absolute: 0.1 10*3/uL (ref 0.0–0.2)
Basos: 1 %
EOS (ABSOLUTE): 0.4 10*3/uL (ref 0.0–0.4)
Eos: 5 %
Hematocrit: 47.1 % (ref 37.5–51.0)
Hemoglobin: 15.9 g/dL (ref 13.0–17.7)
Immature Grans (Abs): 0 10*3/uL (ref 0.0–0.1)
Immature Granulocytes: 1 %
Lymphocytes Absolute: 1.9 10*3/uL (ref 0.7–3.1)
Lymphs: 26 %
MCH: 31.7 pg (ref 26.6–33.0)
MCHC: 33.8 g/dL (ref 31.5–35.7)
MCV: 94 fL (ref 79–97)
Monocytes Absolute: 0.6 10*3/uL (ref 0.1–0.9)
Monocytes: 9 %
Neutrophils Absolute: 4.3 10*3/uL (ref 1.4–7.0)
Neutrophils: 58 %
Platelets: 197 10*3/uL (ref 150–450)
RBC: 5.02 x10E6/uL (ref 4.14–5.80)
RDW: 12.8 % (ref 11.6–15.4)
WBC: 7.3 10*3/uL (ref 3.4–10.8)

## 2019-07-25 LAB — LIPID PANEL
Chol/HDL Ratio: 4.1 ratio (ref 0.0–5.0)
Cholesterol, Total: 124 mg/dL (ref 100–199)
HDL: 30 mg/dL — ABNORMAL LOW (ref >39)
LDL Chol Calc (NIH): 60 mg/dL (ref 0–99)
Triglycerides: 207 mg/dL — ABNORMAL HIGH (ref 0–149)
VLDL Cholesterol Cal: 34 mg/dL (ref 5–40)

## 2019-07-25 LAB — HEMOGLOBIN A1C
Est. average glucose Bld gHb Est-mCnc: 180 mg/dL
Hgb A1c MFr Bld: 7.9 % — ABNORMAL HIGH (ref 4.8–5.6)

## 2019-07-25 LAB — HIV ANTIBODY (ROUTINE TESTING W REFLEX): HIV Screen 4th Generation wRfx: NONREACTIVE

## 2019-07-25 LAB — TSH: TSH: 3.08 u[IU]/mL (ref 0.450–4.500)

## 2019-07-25 LAB — HEPATITIS C ANTIBODY: Hep C Virus Ab: <0.1 s/co ratio (ref 0.0–0.9)

## 2019-09-19 ENCOUNTER — Encounter: Payer: Self-pay | Admitting: Medical

## 2019-09-19 ENCOUNTER — Other Ambulatory Visit: Payer: Self-pay

## 2019-09-19 ENCOUNTER — Ambulatory Visit: Payer: 59 | Admitting: Medical

## 2019-09-19 VITALS — BP 106/72 | HR 50 | Ht 72.0 in | Wt 238.4 lb

## 2019-09-19 DIAGNOSIS — T148XXA Other injury of unspecified body region, initial encounter: Secondary | ICD-10-CM

## 2019-09-19 DIAGNOSIS — E119 Type 2 diabetes mellitus without complications: Secondary | ICD-10-CM

## 2019-09-19 DIAGNOSIS — I422 Other hypertrophic cardiomyopathy: Secondary | ICD-10-CM

## 2019-09-19 DIAGNOSIS — E785 Hyperlipidemia, unspecified: Secondary | ICD-10-CM | POA: Diagnosis not present

## 2019-09-19 DIAGNOSIS — E1169 Type 2 diabetes mellitus with other specified complication: Secondary | ICD-10-CM

## 2019-09-19 DIAGNOSIS — E786 Lipoprotein deficiency: Secondary | ICD-10-CM | POA: Insufficient documentation

## 2019-09-19 HISTORY — DX: Type 2 diabetes mellitus without complications: E11.9

## 2019-09-19 HISTORY — DX: Other injury of unspecified body region, initial encounter: T14.8XXA

## 2019-09-19 MED ORDER — METFORMIN HCL 500 MG PO TABS: 500.0000 mg | ORAL_TABLET | Freq: Two times a day (BID) | ORAL | 0 refills | Status: DC

## 2019-09-19 MED ORDER — ROSUVASTATIN CALCIUM 20 MG PO TABS: 20.0000 mg | ORAL_TABLET | Freq: Every day | ORAL | 3 refills | Status: DC

## 2019-09-19 NOTE — Progress Notes (Signed)
Subjective:  Nathan [name] is a 59 y.o. male who presents for Chief Complaint  Patient presents with  . Diabetes    new onset   . Foot Pain    left foot-possibly have something in it      Here to discuss labs from 07/2019 visit, new onset diabetes.    He has made some changes such as cutting down on soda since that visit.   Here to discuss new diagnoses and recommendations.    He thinks he has splinter of left foot for several weeks.   No other aggravating or relieving factors.    No other c/o.  The following portions of the patient's history were reviewed and updated as appropriate: allergies, current medications, past family history, past medical history, past social history, past surgical history and problem list.  ROS Otherwise as in subjective above  Objective: BP 106/72   Pulse (!) 50   Ht 6' (1.829 m)   Wt 238 lb 6.4 oz (108.1 kg)   SpO2 96%   BMI 32.33 kg/m   General appearance: alert, no distress, well developed, well nourished Left volar foot with small area of fullness of medial mid foot, suggestive of foreign body, possibly metallic   Assessment: Encounter Diagnoses  Name Primary?  . Type 2 diabetes mellitus with other specified complication, without long-term current use of insulin ([place]) Yes  . New onset type 2 diabetes mellitus ([place])   . Apical variant hypertrophic cardiomyopathy ([place])   . Dyslipidemia   . Splinter   . Low HDL (under 40)      Plan: Discussed new diagnosis of diabetes, criteria to make the diagnosis, possible complications of diabetes, and the opportunity to make lifestyle changes to get this under control.  Discussed short term goals of diabetes care.    Discussed follow up, typically every 3 months, lab monitoring, importance of HgbA1C.   Discussed diet in great detail, importance of exercise.  Discussed vaccinations, discussed general preventative measures including eye exams yearly with eye doctor, dental care, routine f/u here.    Discussed medications. Begin trial of Metformin once daily for 2 weeks, then can increase to BID.  discussed risks/benefits of medications  Discussed glucose monitoring.  Can start checking 2-3 days per week.   Nurse gave demonstration of glucometer.  Discussed dietician consult.  We will refer to nutrition consult at this time.  Mixed dyslipidemia - discussed lab findings, elevated TRIG, low HDL.  Increase Crestor dose, make lifestyle changes  Return in a few days for splinter removal as there is not enough time allotted for this today   Nathan [name] was seen today for diabetes and foot pain.  Diagnoses and all orders for this visit:  Type 2 diabetes mellitus with other specified complication, without long-term current use of insulin ([place])  New onset type 2 diabetes mellitus ([place])  Apical variant hypertrophic cardiomyopathy (H[facility])  Dyslipidemia  Splinter  Low HDL (under 40)  Other orders -     metFORMIN (GLUCOPHAGE) 500 MG tablet; Take 1 tablet (500 mg total) by mouth 2 (two) times daily with a meal. -     rosuvastatin (CRESTOR) 20 MG tablet; Take 1 tablet (20 mg total) by mouth daily.   Spent > 30 minutes face to face with patient in discussion of symptoms, evaluation, plan and recommendations.    Follow up: in a few days for splinter, 74mo for diabetes

## 2019-09-19 NOTE — Addendum Note (Signed)
Addended by: Carlena Hurl on: 09/19/2019 10:01 AM   Modules accepted: Level of Service

## 2019-09-19 NOTE — Patient Instructions (Signed)
Type 2 Diabetes  Diabetes is a long-lasting (chronic) disease.  With diabetes, either the pancreas does not make enough of a hormone called insulin, or the body has trouble using the insulin that is made.  Over time, diabetes can damage the eyes, kidneys, and nerves causing retinopathy, nephropathy, and neuropathy.  Diabetes puts you at risk for heart disease and peripheral vascular disease which can lead to heart attack, stroke, foot ulcers, and amputations.    Our goal and hopefully your goal is to manage your diabetes in such a way to slow the progression of the disease and do all we can to keep you healthy  Home Care:   Eat healthy, exercise regularly, limit alcohol, and don't smoke!  Check your blood sugar (glucose) once a day before breakfast, or as indicated by our discussion today.  Take your medications daily, don't run out of medications.  Learn about low blood sugar (hypoglycemia). Know how to treat it.  Wear a necklace or bracelet that says you have diabetes.  Check your feet every night for cuts, sores, blisters, and redness. Tell your medical provider if you have problems.  Maintain a normal body weight, or normal BMI - height to weight ratio of 20-25.  Ask me about this.  GET HELP RIGHT AWAY IF:  You have trouble keeping your blood sugar in target range.  You have problems with your medicines.  You are sick and not getting better after 24 hours.  You have a sore or wound that is not healing.  You have vision problems or changes.  You have a fever.  Exercise regularly since it has beneficial effects on the heart and blood sugars. Exercising at least three times per week or 150 minutes per week can be as important as medication to a diabetic.  Find some form of exercise that you will enjoy doing regularly.  This can include walking, biking, kayaking, golfing, swimming, dance, aerobics, hiking, etc.  If you have joint problems, many local gyms have equipment to  accommodate people with specific needs.    Vaccinations:  Diabetics are at increased risk for infection, and illnesses can take longer to resolve.  Current vaccine recommendations include yearly Influenza (flu) vaccine (recommended in October), Pneumococcal vaccine, Hepatitis B vaccine series, Tdap (tetanus, diptheria and pertussis) vaccine every 10 years, and other age appropriate vaccinations.     Office visits:  We recommend routine medical care to make sure we are addressing prevention and issues as they arise.  Typically this could mean twice yearly or up to quarterly depending upon your unique health situation.  Exams should include a yearly physical, a yearly foot exam, and other examination as appropriate.  You should see an eye doctor yearly to help screen for and prevent blood vessel complications in your eyes.  Labs: Diabetics should have blood work done at least twice yearly to monitor your Hemoglobin A1C (a three-month average of your blood sugars) and your cholesterol.  You should have your urine and blood checked yearly to screen for kidney damage.  This may include creatinine and micro-albumin levels.  Other labs as appropriate.    Blood pressure goals:  Goal blood pressure in diabetics should be 130/80 or less. Monitoring your blood pressure with a home blood pressure cuff of your own is an excellent idea.  If you are prescribed medication for blood pressure, take your medication every day, and don't run out of medication.  Having high blood pressure can damage your heart, eyes, kidneys,  and put you at risk for heart attack and stroke.  Tobacco use:  If you smoke, dip or chew, quitting will reduce your risk of heart attack, stroke, peripheral vascular disease, and many cancers.

## 2019-09-22 ENCOUNTER — Encounter: Payer: Self-pay | Admitting: Medical

## 2019-09-22 ENCOUNTER — Ambulatory Visit: Payer: 59 | Admitting: Medical

## 2019-09-22 ENCOUNTER — Other Ambulatory Visit: Payer: Self-pay

## 2019-09-22 VITALS — BP 108/62 | HR 54 | Ht 72.0 in | Wt 236.6 lb

## 2019-09-22 DIAGNOSIS — M795 Residual foreign body in soft tissue: Secondary | ICD-10-CM | POA: Diagnosis not present

## 2019-09-22 DIAGNOSIS — T148XXA Other injury of unspecified body region, initial encounter: Secondary | ICD-10-CM
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
## 2019-10-10 ENCOUNTER — Ambulatory Visit (INDEPENDENT_AMBULATORY_CARE_PROVIDER_SITE_OTHER): Payer: 59 | Admitting: *Deleted

## 2019-10-10 DIAGNOSIS — I472 Ventricular tachycardia, unspecified: Secondary | ICD-10-CM

## 2019-10-11 LAB — CUP PACEART REMOTE DEVICE CHECK
Battery Remaining Longevity: 44 mo
Battery Remaining Percentage: 44 %
Battery Voltage: 2.93 V
Brady Statistic AP VP Percent: 1 %
Brady Statistic AP VS Percent: 28 %
Brady Statistic AS VP Percent: 1 %
Brady Statistic AS VS Percent: 71 %
Brady Statistic RA Percent Paced: 27 %
Brady Statistic RV Percent Paced: 1 %
Date Time Interrogation Session: 20210727224954
HighPow Impedance: 47 Ohm
HighPow Impedance: 47 Ohm
Implantable Lead Implant Date: 20150910
Implantable Lead Implant Date: 20150910
Implantable Lead Location: 753859
Implantable Lead Location: 753860
Implantable Pulse Generator Implant Date: 20150910
Lead Channel Impedance Value: 390 Ohm
Lead Channel Impedance Value: 440 Ohm
Lead Channel Pacing Threshold Amplitude: 0.5 V
Lead Channel Pacing Threshold Amplitude: 1 V
Lead Channel Pacing Threshold Pulse Width: 0.5 ms
Lead Channel Pacing Threshold Pulse Width: 0.5 ms
Lead Channel Sensing Intrinsic Amplitude: 2.7 mV
Lead Channel Sensing Intrinsic Amplitude: 6.5 mV
Lead Channel Setting Pacing Amplitude: 2 V
Lead Channel Setting Pacing Amplitude: 2.5 V
Lead Channel Setting Pacing Pulse Width: 0.5 ms
Lead Channel Setting Sensing Sensitivity: 0.5 mV
Pulse Gen Serial Number: 7208008

## 2019-10-14 NOTE — Progress Notes (Signed)
Remote ICD transmission.   

## 2019-12-11 ENCOUNTER — Other Ambulatory Visit: Payer: Self-pay | Admitting: Cardiology

## 2019-12-17 ENCOUNTER — Other Ambulatory Visit: Payer: Self-pay | Admitting: Medical

## 2019-12-20 ENCOUNTER — Ambulatory Visit: Payer: 59 | Admitting: Medical

## 2019-12-27 ENCOUNTER — Other Ambulatory Visit: Payer: Self-pay | Admitting: Cardiology

## 2020-01-03 ENCOUNTER — Other Ambulatory Visit: Payer: Self-pay

## 2020-01-03 ENCOUNTER — Ambulatory Visit: Payer: 59 | Admitting: Medical

## 2020-01-03 ENCOUNTER — Encounter: Payer: Self-pay | Admitting: Medical

## 2020-01-03 VITALS — BP 110/70 | HR 50 | Ht 72.0 in | Wt 235.8 lb

## 2020-01-03 DIAGNOSIS — Z23 Encounter for immunization: Secondary | ICD-10-CM | POA: Diagnosis not present

## 2020-01-03 DIAGNOSIS — E786 Lipoprotein deficiency: Secondary | ICD-10-CM | POA: Diagnosis not present

## 2020-01-03 DIAGNOSIS — Z95810 Presence of automatic (implantable) cardiac defibrillator: Secondary | ICD-10-CM

## 2020-01-03 DIAGNOSIS — E785 Hyperlipidemia, unspecified: Secondary | ICD-10-CM | POA: Diagnosis not present

## 2020-01-03 DIAGNOSIS — E1169 Type 2 diabetes mellitus with other specified complication: Secondary | ICD-10-CM | POA: Diagnosis not present

## 2020-01-03 NOTE — Progress Notes (Signed)
Done

## 2020-01-03 NOTE — Progress Notes (Signed)
Subjective:  Nathan [name] is a 59 y.o. male who presents for Chief Complaint  Patient presents with  . Diabetes     Diabetes Mellitus Type II, Follow-up  Lab Results  Component Value Date   HGBA1C 7.9 (H) 07/24/2019   HGBA1C 5.6 08/30/2014   Wt Readings from Last 3 Encounters:  01/03/20 235 lb 12.8 oz (107 kg)  09/22/19 236 lb 9.6 oz (107.3 kg)  09/19/19 238 lb 6.4 oz (108.1 kg)   Last seen for diabetes 3-4 months ago.  Management since then includes Metformin. He reports good compliance with treatment. He is not having side effects.  Symptoms: Yes fatigue No foot ulcerations  No appetite changes No nausea  No paresthesia of the feet  No polydipsia  No polyuria No visual disturbances   No vomiting     Home blood sugar records: fasting range: 140-160 mornings-higher in the evenings   Episodes of hypoglycemia? No    Current insulin regiment: none Most Recent Eye Exam: more than 1 year ago  Current exercise: no regular exercise Current diet habits: sometimes unhealthy   Pertinent Labs: Lab Results  Component Value Date   CHOL 124 07/24/2019   HDL 30 (L) 07/24/2019   LDLCALC 60 07/24/2019   TRIG 207 (H) 07/24/2019   CHOLHDL 4.1 07/24/2019   Lab Results  Component Value Date   NA 136 07/24/2019   K 4.5 07/24/2019   CREATININE 1.18 07/24/2019   GFRNONAA 67 07/24/2019   GFRAA 77 07/24/2019   GLUCOSE 188 (H) 07/24/2019     --------------------------------------------------------------------------------------------------- hyperlipidemia - compliant with Crestor 20mg  daily without c/o.  He tried to do nutrition consult but played phone tag.    During hot summer months didn't get to exercise as much.  No other aggravating or relieving factors.    No other c/o.  The following portions of the patient's history were reviewed and updated as appropriate: allergies, current medications, past family history, past medical history, past social history, past surgical  history and problem list.  ROS Otherwise as in subjective above  Objective: BP 110/70   Pulse (!) 50   Ht 6' (1.829 m)   Wt 235 lb 12.8 oz (107 kg)   SpO2 97%   BMI 31.98 kg/m   General appearance: alert, no distress, well developed, well nourished Heart: RRR, normal S1, S2, no murmurs Lungs: CTA bilaterally, no wheezes, rhonchi, or rales Pulses: 2+ radial pulses, 2+ pedal pulses, normal cap refill Ext: no edema   Assessment: Encounter Diagnoses  Name Primary?  . Type 2 diabetes mellitus with other specified complication, without long-term current use of insulin ([place]) Yes  . Need for influenza vaccination   . Need for vaccination   . Low HDL (under 40)   . Dyslipidemia   . AICD (automatic cardioverter/defibrillator) present      Plan: Diabetes new onset 07/2019.  counseled on diet, exercise, goals for glucose readings.  Labs today.  He will check on free style libre system.   He will try again with nutritions for consult.     Dyslipidemia , low HDL - counseled on diet, medications, labs today.  May lower down to 10mg  if LDL way lower.    C/t routine f/u with cardiology  Counseled on the influenza virus vaccine.  Vaccine information sheet given.  Influenza vaccine given after consent obtained.  Counseled on [name] Covid virus vaccine.  Vaccine information sheet given.  Covid vaccine given after consent obtained.   Nathan [name] was seen  today for diabetes.  Diagnoses and all orders for this visit:  Type 2 diabetes mellitus with other specified complication, without long-term current use of insulin ([facility]) -     Hemoglobin A1c -     Referral to [facility]  Need for influenza vaccination  Need for vaccination  Low HDL (under 40) -     Lipid panel  Dyslipidemia -     Lipid panel  AICD (automatic cardioverter/defibrillator) present    Follow up: pending labs

## 2020-01-03 NOTE — Patient Instructions (Signed)
Recommendations We are checking your cholesterol lipid panel and diabetes A1c screen today  When you see cardiology in December, asked them about their opinion on medication such as Farxiga, Jardiance or Rybelsus in relation to heart disease risk reduction given your new diabetes diagnosis  Also talk with him about your exercise tolerance  Call your insurance to inquire about coverage for one of the continuous glucose monitor such as freestyle libre or Dexcom  If these are not covered, I would recommend checking your blood sugars fasting in the morning at least 3 days/week.  The goal is less than 130 fasting  We want to avoid hypoglycemia or blood sugars less than 70  If your LDL cholesterol comes back significantly lower than I will reduce your Crestor down to 10 mg  Exercise regularly     Vaccine recommendations Shingles vaccine:  I recommend you have a shingles vaccine to help prevent shingles or herpes zoster outbreak.   Please call your insurer to inquire about coverage for the Shingrix vaccine given in 2 doses.   Some insurers cover this vaccine after age 66, some cover this after age 49.  If your insurer covers this, then call to schedule appointment to have this vaccine here.  We updated your Covid booster and flu shot today  You can return at your convenience sometime after 3 to 4 weeks for a Pneumococcal 23 booster

## 2020-01-03 NOTE — Addendum Note (Signed)
Addended by: Edgar Frisk on: 01/03/2020 04:42 PM   Modules accepted: Orders

## 2020-01-05 LAB — LIPID PANEL
Chol/HDL Ratio: 3.3 ratio (ref 0.0–5.0)
Cholesterol, Total: 92 mg/dL — ABNORMAL LOW (ref 100–199)
HDL: 28 mg/dL — ABNORMAL LOW (ref >39)
LDL Chol Calc (NIH): 40 mg/dL (ref 0–99)
Triglycerides: 133 mg/dL (ref 0–149)
VLDL Cholesterol Cal: 24 mg/dL (ref 5–40)

## 2020-01-05 LAB — HEMOGLOBIN A1C
Est. average glucose Bld gHb Est-mCnc: 137 mg/dL
Hgb A1c MFr Bld: 6.4 % — ABNORMAL HIGH (ref 4.8–5.6)

## 2020-01-09 ENCOUNTER — Ambulatory Visit (INDEPENDENT_AMBULATORY_CARE_PROVIDER_SITE_OTHER): Payer: 59

## 2020-01-09 DIAGNOSIS — I422 Other hypertrophic cardiomyopathy: Secondary | ICD-10-CM | POA: Diagnosis not present

## 2020-01-15 LAB — CUP PACEART REMOTE DEVICE CHECK
Battery Remaining Longevity: 42 mo
Battery Remaining Percentage: 41 %
Battery Voltage: 2.93 V
Brady Statistic AP VP Percent: 1 %
Brady Statistic AP VS Percent: 30 %
Brady Statistic AS VP Percent: 1 %
Brady Statistic AS VS Percent: 70 %
Brady Statistic RA Percent Paced: 28 %
Brady Statistic RV Percent Paced: 1 %
Date Time Interrogation Session: 20211101012359
HighPow Impedance: 43 Ohm
HighPow Impedance: 43 Ohm
Implantable Lead Implant Date: 20150910
Implantable Lead Implant Date: 20150910
Implantable Lead Location: 753859
Implantable Lead Location: 753860
Implantable Pulse Generator Implant Date: 20150910
Lead Channel Impedance Value: 350 Ohm
Lead Channel Impedance Value: 410 Ohm
Lead Channel Pacing Threshold Amplitude: 0.5 V
Lead Channel Pacing Threshold Amplitude: 1 V
Lead Channel Pacing Threshold Pulse Width: 0.5 ms
Lead Channel Pacing Threshold Pulse Width: 0.5 ms
Lead Channel Sensing Intrinsic Amplitude: 2.6 mV
Lead Channel Sensing Intrinsic Amplitude: 7.4 mV
Lead Channel Setting Pacing Amplitude: 2 V
Lead Channel Setting Pacing Amplitude: 2.5 V
Lead Channel Setting Pacing Pulse Width: 0.5 ms
Lead Channel Setting Sensing Sensitivity: 0.5 mV
Pulse Gen Serial Number: 7208008

## 2020-01-16 NOTE — Progress Notes (Signed)
Remote ICD transmission.   

## 2020-01-23 ENCOUNTER — Encounter: Payer: Self-pay | Admitting: Dietician

## 2020-01-23 ENCOUNTER — Other Ambulatory Visit: Payer: Self-pay

## 2020-01-23 ENCOUNTER — Encounter: Payer: 59 | Attending: Medical | Admitting: Dietician

## 2020-01-23 DIAGNOSIS — E1169 Type 2 diabetes mellitus with other specified complication: Secondary | ICD-10-CM | POA: Diagnosis present

## 2020-01-23 NOTE — Progress Notes (Signed)
Patient was seen on 01/23/2020 for the first of a series of three diabetes self-management courses at [facility].  Patient Education Plan per assessed needs and concerns is to attend three course education program for Diabetes Self Management Education.  The following learning objectives were met by the patient during this class:  Describe diabetes, types of diabetes and pathophysiology  State some common risk factors for diabetes  Defines the role of glucose and insulin  Describe the relationship between diabetes and cardiovascular and other risks  State the members of the Healthcare Team  States the rationale for glucose monitoring and when to test  State their individual [place] the importance of logging glucose readings and how to interpret the readings  Identifies A1C target  Explain the correlation between A1c and eAG values  State symptoms and treatment of high blood glucose and low blood glucose  Explain proper technique for glucose testing and identify proper sharps disposal  Patient states that he has reduced his regular soda intake from 14 per day to 2 per day (over 3 cups of sugar intake daily prior to diagnosis of diabetes).  Handouts given during class include:  How to Thrive:  A Guide for Your Journey with Diabetes by the ADA  Meal Plan Card and carbohydrate content list  Dietary intake form  Low Sodium Flavoring Tips  Types of Fats  Dining Out  Label reading  Snack list  Planning a balanced meal  The diabetes portion plate  Diabetes Resources  A1c to eAG Conversion Chart  Blood Glucose Log  Diabetes Recommended Care Schedule  Support Group  Diabetes Success Plan  Core Class Satisfaction Survey   Follow-Up Plan:  Attend core 2

## 2020-01-30 ENCOUNTER — Encounter: Payer: 59 | Admitting: Dietician

## 2020-01-30 ENCOUNTER — Other Ambulatory Visit: Payer: Self-pay

## 2020-01-30 DIAGNOSIS — E1169 Type 2 diabetes mellitus with other specified complication: Secondary | ICD-10-CM

## 2020-01-31 ENCOUNTER — Encounter: Payer: Self-pay | Admitting: Dietician

## 2020-01-31 NOTE — Progress Notes (Signed)
Patient was seen on 01/30/2020 for the second of a series of three diabetes self-management courses at the [facility]. The following learning objectives were met by the patient during this class:   Describe the role of different macronutrients on glucose  Explain how carbohydrates affect blood glucose  State what foods contain the most carbohydrates  Demonstrate carbohydrate counting  Demonstrate how to read Nutrition Facts food label  Describe effects of various fats on heart health  Describe the importance of good nutrition for health and healthy eating strategies  Describe techniques for managing your shopping, cooking and meal planning  List strategies to follow meal plan when dining out  Describe the effects of alcohol on glucose and how to use it safely  Goals:  Follow Diabetes Meal Plan as instructed  Aim to spread carbs evenly throughout the day  Aim for 3 meals per day and snacks as needed Include lean protein foods to meals/snacks  Monitor glucose levels as instructed by your doctor   Follow-Up Plan:  Attend Core 3  Work towards following your personal food plan.   

## 2020-02-06 ENCOUNTER — Encounter: Payer: 59 | Admitting: Dietician

## 2020-02-06 ENCOUNTER — Encounter: Payer: Self-pay | Admitting: Dietician

## 2020-02-06 ENCOUNTER — Other Ambulatory Visit: Payer: Self-pay

## 2020-02-06 DIAGNOSIS — E1169 Type 2 diabetes mellitus with other specified complication: Secondary | ICD-10-CM

## 2020-02-06 NOTE — Progress Notes (Signed)
Patient was seen on 02/06/2020 for the third of a series of three diabetes self-management courses at the [facility].   Nathan [name] the amount of activity recommended for healthy living . Describe activities suitable for individual needs . Identify ways to regularly incorporate activity into daily life . Identify barriers to activity and ways to over come these barriers  Identify diabetes medications being personally used and their primary action for lowering glucose and possible side effects . Describe role of stress on blood glucose and develop strategies to address psychosocial issues . Identify diabetes complications and ways to prevent them  Explain how to manage diabetes during illness . Evaluate success in meeting personal goal . Establish 2-3 goals that they will plan to diligently work on  Goals:    I will be active 30 minutes or more 5 times a week  I will take my diabetes medications as scheduled  I will eat less unhealthy fats by eating less fast food   Your patient has identified these potential barriers to change:  none Your patient has identified their diabetes self-care support plan as   none    Plan:  Attend Support Group as desired

## 2020-03-05 ENCOUNTER — Other Ambulatory Visit: Payer: Self-pay | Admitting: Cardiology

## 2020-03-11 DIAGNOSIS — I10 Essential (primary) hypertension: Secondary | ICD-10-CM | POA: Insufficient documentation

## 2020-03-11 DIAGNOSIS — J302 Other seasonal allergic rhinitis: Secondary | ICD-10-CM | POA: Insufficient documentation

## 2020-03-11 DIAGNOSIS — F32A Depression, unspecified: Secondary | ICD-10-CM | POA: Insufficient documentation

## 2020-03-11 DIAGNOSIS — M1990 Unspecified osteoarthritis, unspecified site: Secondary | ICD-10-CM | POA: Insufficient documentation

## 2020-03-11 DIAGNOSIS — E785 Hyperlipidemia, unspecified: Secondary | ICD-10-CM | POA: Insufficient documentation

## 2020-03-11 DIAGNOSIS — F419 Anxiety disorder, unspecified: Secondary | ICD-10-CM | POA: Insufficient documentation

## 2020-03-12 ENCOUNTER — Encounter: Payer: Self-pay | Admitting: Cardiology

## 2020-03-12 ENCOUNTER — Other Ambulatory Visit: Payer: Self-pay

## 2020-03-12 ENCOUNTER — Ambulatory Visit: Payer: 59 | Admitting: Cardiology

## 2020-03-12 VITALS — BP 122/64 | HR 59 | Ht 72.0 in | Wt 240.0 lb

## 2020-03-12 DIAGNOSIS — E785 Hyperlipidemia, unspecified: Secondary | ICD-10-CM | POA: Diagnosis not present

## 2020-03-12 DIAGNOSIS — Z95810 Presence of automatic (implantable) cardiac defibrillator: Secondary | ICD-10-CM

## 2020-03-12 DIAGNOSIS — I422 Other hypertrophic cardiomyopathy: Secondary | ICD-10-CM | POA: Diagnosis not present

## 2020-03-12 DIAGNOSIS — E119 Type 2 diabetes mellitus without complications: Secondary | ICD-10-CM

## 2020-03-12 NOTE — Patient Instructions (Signed)
Medication Instructions:  Your physician recommends that you continue on your current medications as directed. Please refer to the Current Medication list given to you today.  *If you need a refill on your cardiac medications before your next appointment, please call your pharmacy*   Lab Work: None If you have labs (blood work) drawn today and your tests are completely normal, you will receive your results only by: MyChart Message (if you have MyChart) OR A paper copy in the mail If you have any lab test that is abnormal or we need to change your treatment, we will call you to review the results.   Testing/Procedures: Your physician has requested that you have an echocardiogram. Echocardiography is a painless test that uses sound waves to create images of your heart. It provides your doctor with information about the size and shape of your heart and how well your heart's chambers and valves are working. This procedure takes approximately one hour. There are no restrictions for this procedure.    Follow-Up: At [facility], you and your health needs are our priority.  As part of our continuing mission to provide you with exceptional heart care, we have created designated Provider Care Teams.  These Care Teams include your primary Cardiologist (physician) and Advanced Practice Providers (APPs -  Physician Assistants and Nurse Practitioners) who all work together to provide you with the care you need, when you need it.  We recommend signing up for the patient portal called "MyChart".  Sign up information is provided on this After Visit Summary.  MyChart is used to connect with patients for Virtual Visits (Telemedicine).  Patients are able to view lab/test results, encounter notes, upcoming appointments, etc.  Non-urgent messages can be sent to your provider as well.   To learn more about what you can do with MyChart, go to [email or web address].    Your next appointment:   6  month(s)  The format for your next appointment:   In Person  Provider:   Robert Krasowski, MD   Other Instructions   
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
## 2020-03-12 NOTE — Progress Notes (Signed)
EKG

## 2020-03-12 NOTE — Addendum Note (Signed)
Addended by: Delorse Limber I on: 03/12/2020 03:25 PM   Modules accepted: Orders

## 2020-03-23 ENCOUNTER — Other Ambulatory Visit: Payer: Self-pay | Admitting: Medical

## 2020-03-25 ENCOUNTER — Other Ambulatory Visit: Payer: Self-pay | Admitting: Cardiology

## 2020-04-08 ENCOUNTER — Ambulatory Visit (HOSPITAL_BASED_OUTPATIENT_CLINIC_OR_DEPARTMENT_OTHER)
Admission: RE | Admit: 2020-04-08 | Discharge: 2020-04-08 | Disposition: A | Discharge status: Home / Self Care | Payer: 59 | Source: Ambulatory Visit | Attending: Cardiology | Admitting: Cardiology

## 2020-04-08 ENCOUNTER — Other Ambulatory Visit: Payer: Self-pay

## 2020-04-08 DIAGNOSIS — I422 Other hypertrophic cardiomyopathy: Secondary | ICD-10-CM | POA: Diagnosis present

## 2020-04-08 LAB — ECHOCARDIOGRAM COMPLETE
Area-P 1/2: 3.83 cm2
S' Lateral: 3.69 cm

## 2020-04-08 MED ORDER — PERFLUTREN LIPID MICROSPHERE
1.0000 mL | INTRAVENOUS | Status: AC | PRN
Start: 2020-04-08 — End: 2020-04-08
  Administered 2020-04-08: 2 mL via INTRAVENOUS

## 2020-04-09 ENCOUNTER — Ambulatory Visit (INDEPENDENT_AMBULATORY_CARE_PROVIDER_SITE_OTHER): Payer: 59

## 2020-04-09 DIAGNOSIS — I472 Ventricular tachycardia, unspecified: Secondary | ICD-10-CM

## 2020-04-09 LAB — CUP PACEART REMOTE DEVICE CHECK
Battery Remaining Longevity: 40 mo
Battery Remaining Percentage: 39 %
Battery Voltage: 2.92 V
Brady Statistic AP VP Percent: 1 %
Brady Statistic AP VS Percent: 30 %
Brady Statistic AS VP Percent: 1 %
Brady Statistic AS VS Percent: 70 %
Brady Statistic RA Percent Paced: 28 %
Brady Statistic RV Percent Paced: 1 %
Date Time Interrogation Session: 20220125020017
HighPow Impedance: 46 Ohm
HighPow Impedance: 46 Ohm
Implantable Lead Implant Date: 20150910
Implantable Lead Implant Date: 20150910
Implantable Lead Location: 753859
Implantable Lead Location: 753860
Implantable Pulse Generator Implant Date: 20150910
Lead Channel Impedance Value: 360 Ohm
Lead Channel Impedance Value: 450 Ohm
Lead Channel Pacing Threshold Amplitude: 0.5 V
Lead Channel Pacing Threshold Amplitude: 1 V
Lead Channel Pacing Threshold Pulse Width: 0.5 ms
Lead Channel Pacing Threshold Pulse Width: 0.5 ms
Lead Channel Sensing Intrinsic Amplitude: 3.1 mV
Lead Channel Sensing Intrinsic Amplitude: 6.6 mV
Lead Channel Setting Pacing Amplitude: 2 V
Lead Channel Setting Pacing Amplitude: 2.5 V
Lead Channel Setting Pacing Pulse Width: 0.5 ms
Lead Channel Setting Sensing Sensitivity: 0.5 mV
Pulse Gen Serial Number: 7208008

## 2020-04-18 ENCOUNTER — Other Ambulatory Visit: Payer: Self-pay | Admitting: Cardiology

## 2020-04-19 NOTE — Telephone Encounter (Signed)
Refill sent to pharmacy.   

## 2020-04-20 NOTE — Progress Notes (Signed)
Remote ICD transmission.   

## 2020-05-21 ENCOUNTER — Ambulatory Visit: Payer: 59 | Admitting: Dietician

## 2020-06-29 ENCOUNTER — Other Ambulatory Visit: Payer: Self-pay | Admitting: Medical

## 2020-07-09 ENCOUNTER — Ambulatory Visit (INDEPENDENT_AMBULATORY_CARE_PROVIDER_SITE_OTHER): Payer: 59

## 2020-07-09 DIAGNOSIS — I472 Ventricular tachycardia, unspecified: Secondary | ICD-10-CM

## 2020-07-09 LAB — CUP PACEART REMOTE DEVICE CHECK
Battery Remaining Longevity: 38 mo
Battery Remaining Percentage: 37 %
Battery Voltage: 2.92 V
Brady Statistic AP VP Percent: 1 %
Brady Statistic AP VS Percent: 29 %
Brady Statistic AS VP Percent: 1 %
Brady Statistic AS VS Percent: 71 %
Brady Statistic RA Percent Paced: 27 %
Brady Statistic RV Percent Paced: 1 %
Date Time Interrogation Session: 20220426020017
HighPow Impedance: 46 Ohm
HighPow Impedance: 46 Ohm
Implantable Lead Implant Date: 20150910
Implantable Lead Implant Date: 20150910
Implantable Lead Location: 753859
Implantable Lead Location: 753860
Implantable Pulse Generator Implant Date: 20150910
Lead Channel Impedance Value: 360 Ohm
Lead Channel Impedance Value: 440 Ohm
Lead Channel Pacing Threshold Amplitude: 0.5 V
Lead Channel Pacing Threshold Amplitude: 1 V
Lead Channel Pacing Threshold Pulse Width: 0.5 ms
Lead Channel Pacing Threshold Pulse Width: 0.5 ms
Lead Channel Sensing Intrinsic Amplitude: 2.6 mV
Lead Channel Sensing Intrinsic Amplitude: 4.9 mV
Lead Channel Setting Pacing Amplitude: 2 V
Lead Channel Setting Pacing Amplitude: 2.5 V
Lead Channel Setting Pacing Pulse Width: 0.5 ms
Lead Channel Setting Sensing Sensitivity: 0.5 mV
Pulse Gen Serial Number: 7208008

## 2020-07-10 LAB — HM DIABETES EYE EXAM

## 2020-07-24 ENCOUNTER — Ambulatory Visit: Payer: 59 | Admitting: Medical

## 2020-07-24 ENCOUNTER — Encounter: Payer: Self-pay | Admitting: Medical

## 2020-07-24 VITALS — BP 110/72 | HR 50 | Ht 72.0 in | Wt 235.6 lb

## 2020-07-24 DIAGNOSIS — Z23 Encounter for immunization: Secondary | ICD-10-CM | POA: Diagnosis not present

## 2020-07-24 DIAGNOSIS — Z0000 Encounter for general adult medical examination without abnormal findings: Secondary | ICD-10-CM | POA: Insufficient documentation

## 2020-07-24 DIAGNOSIS — Z7185 Encounter for immunization safety counseling: Secondary | ICD-10-CM

## 2020-07-24 DIAGNOSIS — Z95810 Presence of automatic (implantable) cardiac defibrillator: Secondary | ICD-10-CM

## 2020-07-24 DIAGNOSIS — E786 Lipoprotein deficiency: Secondary | ICD-10-CM

## 2020-07-24 DIAGNOSIS — I422 Other hypertrophic cardiomyopathy: Secondary | ICD-10-CM | POA: Diagnosis not present

## 2020-07-24 DIAGNOSIS — Z125 Encounter for screening for malignant neoplasm of prostate: Secondary | ICD-10-CM | POA: Insufficient documentation

## 2020-07-24 DIAGNOSIS — F419 Anxiety disorder, unspecified: Secondary | ICD-10-CM

## 2020-07-24 DIAGNOSIS — I10 Essential (primary) hypertension: Secondary | ICD-10-CM

## 2020-07-24 DIAGNOSIS — E11628 Type 2 diabetes mellitus with other skin complications: Secondary | ICD-10-CM

## 2020-07-24 DIAGNOSIS — E785 Hyperlipidemia, unspecified: Secondary | ICD-10-CM

## 2020-07-24 DIAGNOSIS — J301 Allergic rhinitis due to pollen: Secondary | ICD-10-CM

## 2020-07-24 DIAGNOSIS — E669 Obesity, unspecified: Secondary | ICD-10-CM

## 2020-07-24 DIAGNOSIS — M1990 Unspecified osteoarthritis, unspecified site: Secondary | ICD-10-CM
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
## 2020-07-24 NOTE — Addendum Note (Signed)
Addended by: Edgar Frisk on: 07/24/2020 09:18 AM   Modules accepted: Orders

## 2020-07-24 NOTE — Patient Instructions (Signed)
This visit was a preventative care visit, also known as wellness visit or routine physical.   Topics typically include healthy lifestyle, diet, exercise, preventative care, vaccinations, sick and well care, proper use of emergency dept and after hours care, as well as other concerns.     Recommendations: Continue to return yearly for your annual wellness and preventative care visits.  This gives [name] a chance to discuss healthy lifestyle, exercise, vaccinations, review your chart record, and perform screenings where appropriate.  I recommend you see your eye doctor yearly for routine vision care.  I recommend you see your dentist yearly for routine dental care including hygiene visits twice yearly.   Vaccination recommendations were reviewed Immunization History  Administered Date(s) Administered  . Influenza Inj Mdck Quad Pf 12/29/2016  . Influenza, Seasonal, Injecte, Preservative Fre 02/22/2012  . Influenza,inj,Quad PF,6+ Mos 11/24/2013, 01/18/2018, 11/22/2018, 01/03/2020  . PFIZER(Purple Top)SARS-COV-2 Vaccination 05/15/2019, 06/12/2019, 01/03/2020  . Pneumococcal Polysaccharide-23 08/27/2014  . Tdap 02/22/2012    Counseled on the pneumococcal vaccine.  Vaccine information sheet given.  Pneumococcal vaccine Prevnar 13 given after consent obtained.  Shingles vaccine:  I recommend you have a shingles vaccine to help prevent shingles or herpes zoster outbreak.   Please call your insurer to inquire about coverage for the Shingrix vaccine given in 2 doses.   Some insurers cover this vaccine after age 41, some cover this after age 4.  If your insurer covers this, then call to schedule appointment to have this vaccine here.  He will return in the fall for flu shot and covid booster   Screening for cancer: Colon cancer screening: I reviewed your colonoscopy on file that is up to date from 2020  We discussed PSA, prostate exam, and prostate cancer screening risks/benefits.     Skin cancer  screening: Check your skin regularly for new changes, growing lesions, or other lesions of concern Come in for evaluation if you have skin lesions of concern.  Continue routine follow up with dermatology  Lung cancer screening: If you have a greater than 20 pack year history of tobacco use, then you may qualify for lung cancer screening with a chest CT scan.   Please call your insurance company to inquire about coverage for this test.  We currently don't have screenings for other cancers besides breast, cervical, colon, and lung cancers.  If you have a strong family history of cancer or have other cancer screening concerns, please let me know.    Bone health: Get at least 150 minutes of aerobic exercise weekly Get weight bearing exercise at least once weekly Bone density test:   A bone density test is an imaging test that uses a type of X-ray to measure the amount of calcium and other minerals in your bones.  The test may be used to diagnose or screen you for a condition that causes weak or thin bones (osteoporosis), predict your risk for a broken bone (fracture), or determine how well your osteoporosis treatment is working. The bone density test is recommended for females 68 and older, or females or males <16 if certain risk factors such as thyroid disease, long term use of steroids such as for asthma or rheumatological issues, vitamin D deficiency, estrogen deficiency, family history of osteoporosis, self or family history of fragility fracture in first degree relative.    Heart health: Get at least 150 minutes of aerobic exercise weekly Limit alcohol It is important to maintain a healthy blood pressure and healthy cholesterol numbers  Heart disease screening: Screening for heart disease includes screening for blood pressure, fasting lipids, glucose/diabetes screening, BMI height to weight ratio, reviewed of smoking status, physical activity, and diet.    Goals include blood pressure  120/80 or less, maintaining a healthy lipid/cholesterol profile, preventing diabetes or keeping diabetes numbers under good control, not smoking or using tobacco products, exercising most days per week or at least 150 minutes per week of exercise, and eating healthy variety of fruits and vegetables, healthy oils, and avoiding unhealthy food choices like fried food, fast food, high sugar and high cholesterol foods.    Other tests may possibly include EKG test, CT coronary calcium score, echocardiogram, exercise treadmill stress test.    Medical care options: I recommend you continue to seek care here first for routine care.  We try really hard to have available appointments Monday through Friday daytime hours for sick visits, acute visits, and physicals.  Urgent care should be used for after hours and weekends for significant issues that cannot wait till the next day.  The emergency department should be used for significant potentially life-threatening emergencies.  The emergency department is expensive, can often have long wait times for less significant concerns, so try to utilize primary care, urgent care, or telemedicine when possible to avoid unnecessary trips to the emergency department.  Virtual visits and telemedicine have been introduced since the pandemic started in 2020, and can be convenient ways to receive medical care.  We offer virtual appointments as well to assist you in a variety of options to seek medical care.    Separate significant issues discussed: Diabetes- updated labs today, advise yearly eye doctor visit, he just had an eye doctor visit recently resource to send [name] a copy of the note.  Continue routine foot checks  Hypertension-blood pressure at goal, continue current medication  Hyperlipidemia- history of low HDL.  We discussed possibly adding other agents along with statin.  He is actually taking 2 different doses of statin as there must of been a miscommunication between  [name], him, and cardiology.  He has actually been taking 30 mg of Crestor.  We will change him to only 1 statin pending labs  History of heart disease with cardioverter defibrillator present-continue routine follow-up with cardiology  History of skin cancer-continue routine follow-up with dermatology  Skin lesions of feet, dry skin and lichenified skin-I sent an electronic E consult for specialty provider review through [facility] on their behalf regarding skin lesions.  I will get back in touch with patient pending recommendations from the [facility] consult.

## 2020-07-25 ENCOUNTER — Other Ambulatory Visit: Payer: Self-pay | Admitting: Medical

## 2020-07-25 LAB — CBC
Hematocrit: 44.4 % (ref 37.5–51.0)
Hemoglobin: 14.7 g/dL (ref 13.0–17.7)
MCH: 30.6 pg (ref 26.6–33.0)
MCHC: 33.1 g/dL (ref 31.5–35.7)
MCV: 93 fL (ref 79–97)
Platelets: 208 10*3/uL (ref 150–450)
RBC: 4.8 x10E6/uL (ref 4.14–5.80)
RDW: 12.8 % (ref 11.6–15.4)
WBC: 7 10*3/uL (ref 3.4–10.8)

## 2020-07-25 LAB — COMPREHENSIVE METABOLIC PANEL
ALT: 45 IU/L — ABNORMAL HIGH (ref 0–44)
AST: 36 IU/L (ref 0–40)
Albumin/Globulin Ratio: 1.8 (ref 1.2–2.2)
Albumin: 4.6 g/dL (ref 3.8–4.8)
Alkaline Phosphatase: 52 IU/L (ref 44–121)
BUN/Creatinine Ratio: 12 (ref 10–24)
BUN: 13 mg/dL (ref 8–27)
Bilirubin Total: 0.6 mg/dL (ref 0.0–1.2)
CO2: 22 mmol/L (ref 20–29)
Calcium: 9.5 mg/dL (ref 8.6–10.2)
Chloride: 100 mmol/L (ref 96–106)
Creatinine, Ser: 1.09 mg/dL (ref 0.76–1.27)
Globulin, Total: 2.6 g/dL (ref 1.5–4.5)
Glucose: 197 mg/dL — ABNORMAL HIGH (ref 65–99)
Potassium: 4.9 mmol/L (ref 3.5–5.2)
Sodium: 140 mmol/L (ref 134–144)
Total Protein: 7.2 g/dL (ref 6.0–8.5)
eGFR: 77 mL/min/{1.73_m2} (ref >59)

## 2020-07-25 LAB — MICROALBUMIN / CREATININE URINE RATIO
Creatinine, Urine: 126.7 mg/dL
Microalb/Creat Ratio: 21 mg/g creat (ref 0–29)
Microalbumin, Urine: 26.4 ug/mL

## 2020-07-25 LAB — LIPID PANEL
Chol/HDL Ratio: 3.6 ratio (ref 0.0–5.0)
Cholesterol, Total: 104 mg/dL (ref 100–199)
HDL: 29 mg/dL — ABNORMAL LOW (ref >39)
LDL Chol Calc (NIH): 44 mg/dL (ref 0–99)
Triglycerides: 192 mg/dL — ABNORMAL HIGH (ref 0–149)
VLDL Cholesterol Cal: 31 mg/dL (ref 5–40)

## 2020-07-25 LAB — PSA: Prostate Specific Ag, Serum: 0.6 ng/mL (ref 0.0–4.0)

## 2020-07-25 LAB — HEMOGLOBIN A1C
Est. average glucose Bld gHb Est-mCnc: 174 mg/dL
Hgb A1c MFr Bld: 7.7 % — ABNORMAL HIGH (ref 4.8–5.6)

## 2020-07-29 ENCOUNTER — Telehealth: Payer: Self-pay | Admitting: Family Medicine

## 2020-07-29 NOTE — Telephone Encounter (Signed)
Pt called and said he has not been a very good Diabetic and would like to be given another 4 months to work on his diabetes before adding any Ozempic.  Please advise pt if you are ok with that?

## 2020-07-30 NOTE — Progress Notes (Signed)
Remote ICD transmission.   

## 2020-07-31 NOTE — Telephone Encounter (Signed)
Let me give you some options:  Obviously the numbers are not at goal.  I certainly want you to work on healthy lifestyle, regular exercise, and losing weight  We could increase metformin although sometimes higher doses caused loose stool and nausea  The reason for something like Ozempic or Jardiance or Rybelsus is that they work to help lower sugars but also provide an added heart disease benefit.  In studies these medicines tend to help reduce risk of cardiac events, plus they can help with weight loss in many cases  1 other option is that we work with the company that does pharmaceutical studies.  They have a diabetes study going on currently.  Benefits of the study would be free study medication, a decent financial stipend for being a part of the study, and regular monitoring of labs free as well as dietary counseling  If interested I would like to have him talk with the contact person with the study  Let me know?

## 2020-07-31 NOTE — Telephone Encounter (Signed)
Patient would like to discuss the study and make a decision from there.

## 2020-08-01 ENCOUNTER — Encounter: Payer: Self-pay | Admitting: Medical

## 2020-08-01 NOTE — Telephone Encounter (Signed)
Let him know that I will have Selinda Eon call him from the study.  She has the details .  If he decides to participate and if he is eliglbe then let me know . If not, also let me know so I can track back around to discuss other strategies to lower sugars.  Refer to [employer] diabetes study

## 2020-08-06 ENCOUNTER — Encounter: Payer: Self-pay | Admitting: Medical

## 2020-10-08 ENCOUNTER — Ambulatory Visit (INDEPENDENT_AMBULATORY_CARE_PROVIDER_SITE_OTHER): Payer: 59

## 2020-10-08 DIAGNOSIS — I422 Other hypertrophic cardiomyopathy: Secondary | ICD-10-CM

## 2020-10-08 LAB — CUP PACEART REMOTE DEVICE CHECK
Battery Remaining Longevity: 35 mo
Battery Remaining Percentage: 35 %
Battery Voltage: 2.9 V
Brady Statistic AP VP Percent: 1 %
Brady Statistic AP VS Percent: 29 %
Brady Statistic AS VP Percent: 1 %
Brady Statistic AS VS Percent: 71 %
Brady Statistic RA Percent Paced: 27 %
Brady Statistic RV Percent Paced: 1 %
Date Time Interrogation Session: 20220726020022
HighPow Impedance: 51 Ohm
HighPow Impedance: 51 Ohm
Implantable Lead Implant Date: 20150910
Implantable Lead Implant Date: 20150910
Implantable Lead Location: 753859
Implantable Lead Location: 753860
Implantable Pulse Generator Implant Date: 20150910
Lead Channel Impedance Value: 360 Ohm
Lead Channel Impedance Value: 480 Ohm
Lead Channel Pacing Threshold Amplitude: 0.5 V
Lead Channel Pacing Threshold Amplitude: 1 V
Lead Channel Pacing Threshold Pulse Width: 0.5 ms
Lead Channel Pacing Threshold Pulse Width: 0.5 ms
Lead Channel Sensing Intrinsic Amplitude: 3.1 mV
Lead Channel Sensing Intrinsic Amplitude: 5.1 mV
Lead Channel Setting Pacing Amplitude: 2 V
Lead Channel Setting Pacing Amplitude: 2.5 V
Lead Channel Setting Pacing Pulse Width: 0.5 ms
Lead Channel Setting Sensing Sensitivity: 0.5 mV
Pulse Gen Serial Number: 7208008

## 2020-10-15 ENCOUNTER — Other Ambulatory Visit: Payer: Self-pay | Admitting: Medical

## 2020-10-17 ENCOUNTER — Ambulatory Visit: Payer: 59 | Admitting: Medical

## 2020-10-17 ENCOUNTER — Other Ambulatory Visit: Payer: Self-pay

## 2020-10-17 ENCOUNTER — Telehealth: Payer: Self-pay | Admitting: Medical

## 2020-10-17 VITALS — BP 118/70 | HR 55 | Wt 231.2 lb

## 2020-10-17 DIAGNOSIS — E785 Hyperlipidemia, unspecified: Secondary | ICD-10-CM

## 2020-10-17 DIAGNOSIS — E11628 Type 2 diabetes mellitus with other skin complications: Secondary | ICD-10-CM

## 2020-10-17 DIAGNOSIS — Z23 Encounter for immunization: Secondary | ICD-10-CM | POA: Insufficient documentation

## 2020-10-17 DIAGNOSIS — G8929 Other chronic pain: Secondary | ICD-10-CM | POA: Insufficient documentation

## 2020-10-17 DIAGNOSIS — M545 Low back pain, unspecified: Secondary | ICD-10-CM | POA: Diagnosis not present

## 2020-10-17 MED ORDER — ROSUVASTATIN CALCIUM 20 MG PO TABS: 20.0000 mg | ORAL_TABLET | Freq: Every day | ORAL | 3 refills | Status: DC

## 2020-10-17 MED ORDER — OMEGA-3-ACID ETHYL ESTERS 1 G PO CAPS: 2.0000 g | ORAL_CAPSULE | Freq: Two times a day (BID) | ORAL | 11 refills | Status: DC

## 2020-10-17 MED ORDER — METFORMIN HCL ER 750 MG PO TB24: 750.0000 mg | ORAL_TABLET | Freq: Every day | ORAL | 1 refills | Status: DC

## 2020-10-17 MED ORDER — EMPAGLIFLOZIN 10 MG PO TABS: 10.0000 mg | ORAL_TABLET | Freq: Every day | ORAL | 2 refills | Status: DC

## 2020-10-17 NOTE — Telephone Encounter (Signed)
Pt was notified of results and pt will go pick up medication
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
## 2020-10-17 NOTE — Telephone Encounter (Signed)
Please call patient back  The drug study would not start any sooner than November  Thus I would like to make the following changes:  Continue metformin but I am changing the dose to 750 mg XR daily, 1 tablet daily  I would like him to begin a once daily tablet called Jardiance instead of the shot we were talking about.  Jardiance is 1 tablet daily and it helps to lower sugars by excreting excess sugar from the urine.  So he may urinate a little bit more.  Make sure he drinks plenty of water daily in general.  This medicine also helps to reduce potential heart failure risk.  A lot of cardiologist are prescribing these medicines for heart patients although they are diabetes medications as well.  I would also like him to begin prescription fish oil called Lovaza to go along with his rosuvastatin Crestor daily  Since 2 these are new, have him begin the Jardiance for a week or 2 before beginning low vase a in the event of any adverse effect.  If either medication too expensive or not covered by insurance let me know right away  I would like to see him back in 3 months fasting after starting these medications

## 2020-10-22 ENCOUNTER — Other Ambulatory Visit: Payer: Self-pay | Admitting: Medical

## 2020-10-22 ENCOUNTER — Telehealth: Payer: Self-pay | Admitting: Medical

## 2020-10-22 ENCOUNTER — Other Ambulatory Visit: Payer: Self-pay | Admitting: Cardiology

## 2020-10-22 MED ORDER — DAPAGLIFLOZIN PROPANEDIOL 5 MG PO TABS: 5.0000 mg | ORAL_TABLET | Freq: Every day | ORAL | 0 refills | Status: DC

## 2020-10-22 MED ORDER — ICOSAPENT ETHYL 1 G PO CAPS: 2.0000 g | ORAL_CAPSULE | Freq: Two times a day (BID) | ORAL | 5 refills | Status: DC

## 2020-10-22 NOTE — Telephone Encounter (Signed)
Pt called and said the Jardiance and Fish oil is too expensive and wanted to see if you had another alternative. He said in the mean time he will call his insurance company to see if they had a cheaper alternative also.

## 2020-10-23 NOTE — Telephone Encounter (Signed)
Please advise if we need to change med due to coverage . Nathan [name]

## 2020-10-25 ENCOUNTER — Encounter: Payer: Self-pay | Admitting: Internal Medicine

## 2020-10-29 NOTE — Telephone Encounter (Signed)
Called pt and let him know what was going on & completed P.A. VASCEPA

## 2020-10-29 NOTE — Telephone Encounter (Signed)
[place] is covered cost is $45 and there is a discount card online so should be even cheaper.  Farxiga, Lovaza & Vascepa are all not covered.  They will fax P.A's

## 2020-10-30 ENCOUNTER — Telehealth: Payer: Self-pay | Admitting: Medical

## 2020-10-30 NOTE — Telephone Encounter (Signed)
After his most recent visit the initial plan was to make some med changes.  There have been some many back-and-forth messages now regarding insurance prior authorization request that I am confused.  He should be on metformin XR 750 mg at this point as I increased this recently  He should be on rosuvastatin Crestor for cholesterol and Toprol-XL for blood pressure  The chart shows [facility] Glade but I thought we had talked about possibly the insurance would only cover Jardiance instead.  So does he actually have [name] or Jardiance prescription at this point, and which 1 is actually covered?  And I assume he does not have Vascepa nor Lovaza fish oil correct.     Lets clear up what he is actually taking and what has actually been covered by insurance so I can readjust if needed

## 2020-10-31 MED ORDER — VASCEPA 1 G PO CAPS: 2.0000 g | ORAL_CAPSULE | Freq: Two times a day (BID) | ORAL | 5 refills | Status: DC

## 2020-11-01 MED ORDER — EMPAGLIFLOZIN 10 MG PO TABS: 10.0000 mg | ORAL_TABLET | Freq: Every day | ORAL | 2 refills | Status: DC

## 2020-11-01 NOTE — Telephone Encounter (Signed)
Nathan [name] see 10/30/20 telephone call

## 2020-11-01 NOTE — Telephone Encounter (Signed)
Nathan [name] was covered instead of [name] and went thru for $42.  I got the Vascepa approved and with discount card got it thru for $9.   I called pt and went over all medications and I updated pt's medications to reflect these changes.

## 2020-11-01 NOTE — Telephone Encounter (Signed)
P.A.  approved and I activated discount card and called pharmacy and went thru for $9.  Pt informed

## 2020-11-04 NOTE — Progress Notes (Signed)
Remote ICD transmission.   

## 2020-12-18 ENCOUNTER — Encounter: Payer: Self-pay | Admitting: Cardiology

## 2020-12-18 ENCOUNTER — Other Ambulatory Visit: Payer: Self-pay

## 2020-12-18 ENCOUNTER — Ambulatory Visit: Payer: 59 | Admitting: Cardiology

## 2020-12-18 VITALS — BP 142/75 | HR 50 | Ht 71.0 in | Wt 230.0 lb

## 2020-12-18 DIAGNOSIS — E11628 Type 2 diabetes mellitus with other skin complications: Secondary | ICD-10-CM | POA: Diagnosis not present

## 2020-12-18 DIAGNOSIS — D225 Melanocytic nevi of trunk: Secondary | ICD-10-CM | POA: Insufficient documentation

## 2020-12-18 DIAGNOSIS — D2239 Melanocytic nevi of other parts of face: Secondary | ICD-10-CM | POA: Insufficient documentation

## 2020-12-18 DIAGNOSIS — I422 Other hypertrophic cardiomyopathy: Secondary | ICD-10-CM | POA: Diagnosis not present

## 2020-12-18 DIAGNOSIS — L814 Other melanin hyperpigmentation: Secondary | ICD-10-CM | POA: Insufficient documentation

## 2020-12-18 DIAGNOSIS — I10 Essential (primary) hypertension: Secondary | ICD-10-CM | POA: Diagnosis not present

## 2020-12-18 DIAGNOSIS — L918 Other hypertrophic disorders of the skin: Secondary | ICD-10-CM | POA: Insufficient documentation

## 2020-12-18 DIAGNOSIS — D2370 Other benign neoplasm of skin of unspecified lower limb, including hip: Secondary | ICD-10-CM | POA: Insufficient documentation

## 2020-12-18 DIAGNOSIS — Z85828 Personal history of other malignant neoplasm of skin: Secondary | ICD-10-CM | POA: Insufficient documentation

## 2020-12-18 DIAGNOSIS — Z95810 Presence of automatic (implantable) cardiac defibrillator: Secondary | ICD-10-CM

## 2020-12-18 MED ORDER — ASPIRIN EC 81 MG PO TBEC: 81.0000 mg | DELAYED_RELEASE_TABLET | Freq: Every day | ORAL | 3 refills | Status: DC

## 2020-12-18 NOTE — Patient Instructions (Addendum)
Medication Instructions:  Your physician has recommended you make the following change in your medication:  START: Aspirin 81 mg daily  *If you need a refill on your cardiac medications before your next appointment, please call your pharmacy*   Lab Work: None If you have labs (blood work) drawn today and your tests are completely normal, you will receive your results only by: [place] (if you have MyChart) OR A paper copy in the mail If you have any lab test that is abnormal or we need to change your treatment, we will call you to review the results.   Testing/Procedures: None   Follow-Up: At [facility], you and your health needs are our priority.  As part of our continuing mission to provide you with exceptional heart care, we have created designated Provider Care Teams.  These Care Teams include your primary Cardiologist (physician) and Advanced Practice Providers (APPs -  Physician Assistants and Nurse Practitioners) who all work together to provide you with the care you need, when you need it.  We recommend signing up for the patient portal called "MyChart".  Sign up information is provided on this After Visit Summary.  MyChart is used to connect with patients for Virtual Visits (Telemedicine).  Patients are able to view lab/test results, encounter notes, upcoming appointments, etc.  Non-urgent messages can be sent to your provider as well.   To learn more about what you can do with MyChart, go to [email or web address].    Your next appointment:   6 month(s)  The format for your next appointment:   In Person  Provider:   Jenne Campus, MD   Other Instructions  Aspirin and Your Heart Aspirin is a medicine that prevents the platelets in your blood from sticking together. Platelets are the cells that your blood uses for clotting. Aspirin can be used to help reduce the risk of blood clots, heart attacks, and other heart-related problems. What are the  risks? Daily use of aspirin can cause side effects. Some of these include: Bleeding. Bleeding can be minor or serious. An example of minor bleeding is bleeding from a cut, and the bleeding does not stop. An example of more serious bleeding is stomach bleeding or, rarely, bleeding into the brain. Your risk of bleeding increases if you are also taking NSAIDs, such as ibuprofen. Increased bruising. Upset stomach. An allergic reaction. People who have growths inside the nose (nasal polyps) have an increased risk of developing an aspirin allergy. How to use aspirin to care for your heart  Take aspirin only as told by your health care provider. Make sure that you understand how much to take and what form to take. The two forms of aspirin are: Non-enteric-coated.This type of aspirin does not have a coating and is absorbed quickly. This type of aspirin also comes in a chewable form. Enteric-coated. This type of aspirin has a coating that releases the medicine very slowly. Enteric-coated aspirin might cause less stomach upset than non-enteric-coated aspirin. This type of aspirin should not be chewed or crushed. Work with your health care provider to find out whether it is safe and beneficial for you to take aspirin daily. Taking aspirin daily may be helpful if: You have had a heart attack or chest pain, or you are at risk for a heart attack. You have a condition in which certain heart vessels are blocked (coronary artery disease), and you have had a procedure to treat it. Examples are: Open-heart surgery, such as coronary artery bypass surgery (  CABG). Coronary angioplasty,which is done to widen a blood vessel of your heart. Having a small mesh tube, or stent, placed in your coronary artery. You have had certain types of stroke or a mini-stroke known as a transient ischemic attack (TIA). You have a narrowing of the arteries that supply the limbs (peripheral vascular disease, or PVD). You have long-term  (chronic) heart rhythm problems, such as atrial fibrillation, and your health care provider thinks aspirin may help. You have valve disease, have had a heart valve replacement, or have had surgery on a valve. You are considered at increased risk of developing coronary artery disease or PVD. Follow these instructions at home Medicines Take over-the-counter and prescription medicines only as told by your health care provider. If you are taking blood thinners: Talk with your health care provider before you take any medicines that contain aspirin or NSAIDs, such as ibuprofen. These medicines increase your risk for dangerous bleeding. Take your medicine exactly as told, at the same time every day. Avoid activities that could cause injury or bruising, and follow instructions about how to prevent falls. Wear a medical alert bracelet or carry a card that lists what medicines you take. General instructions Do not drink alcohol if: Your health care provider tells you not to drink. You are pregnant, may be pregnant, or are planning to become pregnant. If you drink alcohol: Limit how much you have to: 0-1 drink a day for women. 0-2 drinks a day for men. Know how much alcohol is in your drink. In the [place] one drink equals one 12 oz bottle of beer (355 mL), one 5 oz glass of wine (148 mL), or one 1 oz glass of hard liquor (44 mL). Keep all follow-up visits. This is important. Where to find more information The American Heart Association: www.heart.org The Centers for Disease Control and Prevention: http://www.wolf.info/ Contact a health care provider if: You have unusual bleeding or bruising. You have stomach pain or you feel nauseous. You have ringing in your ears. You have an allergic reaction that causes hives, itchy skin, or swelling of the lips, tongue, or face. Get help right away if: Your bowel movements are bloody, dark red, or black. You vomit or cough up blood. You have blood in your urine. You  have a cough, make high-pitched whistling sounds most often heard when you breathe out (wheeze), or feel short of breath. You have chest pain, especially if the pain spreads to your arms, back, neck, or jaw. You have any symptoms of a stroke. "BE FAST" is an easy way to remember the main warning signs of a stroke: B - Balance. Signs are dizziness, sudden trouble walking, or loss of balance. E - Eyes. Signs are trouble seeing or a sudden change in vision. F - Face. Signs are sudden weakness or numbness of the face, or the face or eyelid drooping on one side. A - Arms. Signs are weakness or numbness in an arm. This happens suddenly and usually on one side of the body. S - Speech. Signs are sudden trouble speaking, slurred speech, or trouble understanding what people say. T - Time. Time to call emergency services. Write down what time symptoms started. You have other signs of a stroke, such as: A sudden, severe headache with no known cause. Confusion. Nausea or vomiting. Seizure. These symptoms may represent a serious problem that is an emergency. Do not wait to see if the symptoms will go away. Get medical help right away. Call your local  emergency services (911 in the [place] Do not drive yourself to the hospital. Summary Aspirin use can help reduce the risk of blood clots, heart attacks, and other heart-related problems. Daily use of aspirin can cause side effects. Take aspirin only as told by your health care provider. Make sure that you understand how much to take and what form to take. Your health care provider will help you determine whether it is safe and beneficial for you to take aspirin daily. This information is not intended to replace advice given to you by your health care provider. Make sure you discuss any questions you have with your health care provider. Document Revised: 05/04/2020 Document Reviewed: 05/04/2020 Elsevier Patient Education  [place].
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
## 2020-12-18 NOTE — Addendum Note (Signed)
Addended by: Senaida Ores on: 12/18/2020 11:07 AM   Modules accepted: Orders

## 2020-12-25 ENCOUNTER — Telehealth: Payer: Self-pay | Admitting: Internal Medicine

## 2020-12-25 MED ORDER — METFORMIN HCL ER 750 MG PO TB24: 750.0000 mg | ORAL_TABLET | Freq: Every day | ORAL | 0 refills | Status: DC

## 2020-12-25 MED ORDER — EMPAGLIFLOZIN 10 MG PO TABS: 10.0000 mg | ORAL_TABLET | Freq: Every day | ORAL | 0 refills | Status: DC

## 2020-12-25 NOTE — Telephone Encounter (Signed)
Pt was asked for a refill on medications

## 2021-01-07 ENCOUNTER — Ambulatory Visit (INDEPENDENT_AMBULATORY_CARE_PROVIDER_SITE_OTHER): Payer: 59

## 2021-01-07 DIAGNOSIS — I422 Other hypertrophic cardiomyopathy: Secondary | ICD-10-CM | POA: Diagnosis not present

## 2021-01-07 LAB — CUP PACEART REMOTE DEVICE CHECK
Battery Remaining Longevity: 33 mo
Battery Remaining Percentage: 32 %
Battery Voltage: 2.9 V
Brady Statistic AP VP Percent: 1 %
Brady Statistic AP VS Percent: 30 %
Brady Statistic AS VP Percent: 1 %
Brady Statistic AS VS Percent: 70 %
Brady Statistic RA Percent Paced: 28 %
Brady Statistic RV Percent Paced: 1 %
Date Time Interrogation Session: 20221025051712
HighPow Impedance: 49 Ohm
HighPow Impedance: 49 Ohm
Implantable Lead Implant Date: 20150910
Implantable Lead Implant Date: 20150910
Implantable Lead Location: 753859
Implantable Lead Location: 753860
Implantable Pulse Generator Implant Date: 20150910
Lead Channel Impedance Value: 360 Ohm
Lead Channel Impedance Value: 480 Ohm
Lead Channel Pacing Threshold Amplitude: 0.5 V
Lead Channel Pacing Threshold Amplitude: 1 V
Lead Channel Pacing Threshold Pulse Width: 0.5 ms
Lead Channel Pacing Threshold Pulse Width: 0.5 ms
Lead Channel Sensing Intrinsic Amplitude: 2.8 mV
Lead Channel Sensing Intrinsic Amplitude: 4.4 mV
Lead Channel Setting Pacing Amplitude: 2 V
Lead Channel Setting Pacing Amplitude: 2.5 V
Lead Channel Setting Pacing Pulse Width: 0.5 ms
Lead Channel Setting Sensing Sensitivity: 0.5 mV
Pulse Gen Serial Number: 7208008

## 2021-01-15 NOTE — Progress Notes (Signed)
Remote ICD transmission.   

## 2021-01-16 ENCOUNTER — Telehealth: Payer: Self-pay

## 2021-01-16 NOTE — Telephone Encounter (Signed)
Left patient a voicemail to return call.

## 2021-01-16 NOTE — Telephone Encounter (Signed)
Patient called and scheduled flu shot for next Tuesday. State she wants to get whatever other vaccines he is due for  Patient is due Shingrix and [place] bivalent booster ( last [employer] booster regular 10/17/20)   He wants to know which of these vaccines is most important to you to get with his flu shot next week and will get that one or all vaccines

## 2021-01-17 NOTE — Telephone Encounter (Signed)
Flu shot and covid booster added to nurse visit notes next week

## 2021-01-21 ENCOUNTER — Other Ambulatory Visit: Payer: Self-pay

## 2021-01-21 ENCOUNTER — Other Ambulatory Visit (INDEPENDENT_AMBULATORY_CARE_PROVIDER_SITE_OTHER): Payer: 59

## 2021-01-21 DIAGNOSIS — Z23 Encounter for immunization: Secondary | ICD-10-CM | POA: Diagnosis not present

## 2021-02-18 ENCOUNTER — Telehealth: Payer: Self-pay | Admitting: Internal Medicine

## 2021-02-18 MED ORDER — ONETOUCH DELICA LANCETS 30G MISC: 1 refills | Status: DC

## 2021-02-18 MED ORDER — ONETOUCH ULTRA VI STRP: ORAL_STRIP | 0 refills | Status: DC

## 2021-02-18 NOTE — Telephone Encounter (Signed)
Pt called and asked for strips and lancets be sent in for his onetouch ultra 2 meter

## 2021-02-25 ENCOUNTER — Other Ambulatory Visit: Payer: Self-pay | Admitting: Cardiology

## 2021-02-26 ENCOUNTER — Other Ambulatory Visit: Payer: Self-pay

## 2021-02-26 ENCOUNTER — Ambulatory Visit: Payer: 59 | Admitting: Medical

## 2021-02-26 VITALS — BP 110/70 | HR 50 | Wt 223.8 lb

## 2021-02-26 DIAGNOSIS — R1310 Dysphagia, unspecified: Secondary | ICD-10-CM

## 2021-02-26 DIAGNOSIS — L989 Disorder of the skin and subcutaneous tissue, unspecified: Secondary | ICD-10-CM | POA: Insufficient documentation

## 2021-02-26 DIAGNOSIS — E1165 Type 2 diabetes mellitus with hyperglycemia: Secondary | ICD-10-CM | POA: Diagnosis not present

## 2021-02-26 DIAGNOSIS — M545 Low back pain, unspecified: Secondary | ICD-10-CM

## 2021-02-26 DIAGNOSIS — G8929 Other chronic pain: Secondary | ICD-10-CM

## 2021-02-26 DIAGNOSIS — E785 Hyperlipidemia, unspecified: Secondary | ICD-10-CM

## 2021-02-26 DIAGNOSIS — Z23 Encounter for immunization: Secondary | ICD-10-CM | POA: Diagnosis not present

## 2021-02-26 DIAGNOSIS — Z95810 Presence of automatic (implantable) cardiac defibrillator: Secondary | ICD-10-CM | POA: Diagnosis not present

## 2021-02-26 DIAGNOSIS — I10 Essential (primary) hypertension: Secondary | ICD-10-CM

## 2021-02-26 DIAGNOSIS — I422 Other hypertrophic cardiomyopathy: Secondary | ICD-10-CM | POA: Diagnosis not present

## 2021-02-26 LAB — POCT GLYCOSYLATED HEMOGLOBIN (HGB A1C): Hemoglobin A1C: 7.3 % — AB (ref 4.0–5.6)

## 2021-02-26 MED ORDER — MUPIROCIN CALCIUM 2 % NA OINT: 1.0000 "application " | TOPICAL_OINTMENT | Freq: Two times a day (BID) | NASAL | 1 refills | Status: DC
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
## 2021-02-27 LAB — LIPID PANEL
Chol/HDL Ratio: 3.8 ratio (ref 0.0–5.0)
Cholesterol, Total: 102 mg/dL (ref 100–199)
HDL: 27 mg/dL — ABNORMAL LOW (ref >39)
LDL Chol Calc (NIH): 51 mg/dL (ref 0–99)
Triglycerides: 137 mg/dL (ref 0–149)
VLDL Cholesterol Cal: 24 mg/dL (ref 5–40)

## 2021-02-27 LAB — COMPREHENSIVE METABOLIC PANEL
ALT: 27 IU/L (ref 0–44)
AST: 23 IU/L (ref 0–40)
Albumin/Globulin Ratio: 1.7 (ref 1.2–2.2)
Albumin: 4.7 g/dL (ref 3.8–4.8)
Alkaline Phosphatase: 51 IU/L (ref 44–121)
BUN/Creatinine Ratio: 13 (ref 10–24)
BUN: 14 mg/dL (ref 8–27)
Bilirubin Total: 0.7 mg/dL (ref 0.0–1.2)
CO2: 23 mmol/L (ref 20–29)
Calcium: 9.6 mg/dL (ref 8.6–10.2)
Chloride: 102 mmol/L (ref 96–106)
Creatinine, Ser: 1.09 mg/dL (ref 0.76–1.27)
Globulin, Total: 2.7 g/dL (ref 1.5–4.5)
Glucose: 150 mg/dL — ABNORMAL HIGH (ref 70–99)
Potassium: 4.6 mmol/L (ref 3.5–5.2)
Sodium: 141 mmol/L (ref 134–144)
Total Protein: 7.4 g/dL (ref 6.0–8.5)
eGFR: 77 mL/min/{1.73_m2} (ref >59)

## 2021-03-08 ENCOUNTER — Other Ambulatory Visit: Payer: Self-pay | Admitting: Cardiology

## 2021-03-11 NOTE — Telephone Encounter (Signed)
Metoprolol Succinate 100 mg # 135 x 3 refills sent to   [facility] ([facility] ) - [place], [place]

## 2021-04-08 ENCOUNTER — Ambulatory Visit (INDEPENDENT_AMBULATORY_CARE_PROVIDER_SITE_OTHER): Payer: 59

## 2021-04-08 DIAGNOSIS — I472 Ventricular tachycardia, unspecified: Secondary | ICD-10-CM

## 2021-04-08 LAB — CUP PACEART REMOTE DEVICE CHECK
Battery Remaining Longevity: 31 mo
Battery Remaining Percentage: 30 %
Battery Voltage: 2.9 V
Brady Statistic AP VP Percent: 1 %
Brady Statistic AP VS Percent: 30 %
Brady Statistic AS VP Percent: 1 %
Brady Statistic AS VS Percent: 70 %
Brady Statistic RA Percent Paced: 29 %
Brady Statistic RV Percent Paced: 1 %
Date Time Interrogation Session: 20230124024731
HighPow Impedance: 53 Ohm
HighPow Impedance: 54 Ohm
Implantable Lead Implant Date: 20150910
Implantable Lead Implant Date: 20150910
Implantable Lead Location: 753859
Implantable Lead Location: 753860
Implantable Pulse Generator Implant Date: 20150910
Lead Channel Impedance Value: 390 Ohm
Lead Channel Impedance Value: 490 Ohm
Lead Channel Pacing Threshold Amplitude: 0.5 V
Lead Channel Pacing Threshold Amplitude: 1 V
Lead Channel Pacing Threshold Pulse Width: 0.5 ms
Lead Channel Pacing Threshold Pulse Width: 0.5 ms
Lead Channel Sensing Intrinsic Amplitude: 3 mV
Lead Channel Sensing Intrinsic Amplitude: 5.7 mV
Lead Channel Setting Pacing Amplitude: 2 V
Lead Channel Setting Pacing Amplitude: 2.5 V
Lead Channel Setting Pacing Pulse Width: 0.5 ms
Lead Channel Setting Sensing Sensitivity: 0.5 mV
Pulse Gen Serial Number: 7208008

## 2021-04-18 NOTE — Progress Notes (Signed)
Remote ICD transmission.   

## 2021-04-27 ENCOUNTER — Other Ambulatory Visit: Payer: Self-pay | Admitting: Medical

## 2021-04-30 ENCOUNTER — Other Ambulatory Visit: Payer: 59

## 2021-05-07 ENCOUNTER — Encounter: Payer: Self-pay | Admitting: Cardiology

## 2021-05-07 ENCOUNTER — Telehealth: Payer: Self-pay | Admitting: Internal Medicine

## 2021-05-07 NOTE — Telephone Encounter (Signed)
Pt reports on Saturday he felt cold/ clammy and rundown.  He also noted his BP was up for him.  He dank some water and rested.  He has felt better since then.  He has been home near his monitor.  Nothing has transmitted as far as an alert.  He is not home now to send a transmission, but after discussion and given that he feels better now, he will hold off sending a transmission unless he has further symptoms./

## 2021-05-07 NOTE — Telephone Encounter (Signed)
Error

## 2021-05-07 NOTE — Telephone Encounter (Signed)
°  1. Has your device fired? No   2. Is you device beeping? No   3. Are you experiencing draining or swelling at device site? No   4. Are you calling to see if we received your device transmission? No   5. Have you passed out? No   Patient states he has not been feeling his best the past couple of days and is wanting a nurse to look over his transmission readings to confirm there is not an issue going on with it. Reports the device has not gone off at all.     Please route to [place]

## 2021-06-11 ENCOUNTER — Other Ambulatory Visit (INDEPENDENT_AMBULATORY_CARE_PROVIDER_SITE_OTHER): Payer: 59

## 2021-06-11 ENCOUNTER — Other Ambulatory Visit: Payer: Self-pay

## 2021-06-11 DIAGNOSIS — Z23 Encounter for immunization: Secondary | ICD-10-CM | POA: Diagnosis not present

## 2021-06-23 LAB — HM DIABETES EYE EXAM

## 2021-06-27 ENCOUNTER — Encounter: Payer: Self-pay | Admitting: Internal Medicine

## 2021-07-02 ENCOUNTER — Encounter: Payer: Self-pay | Admitting: Medical

## 2021-07-05 ENCOUNTER — Other Ambulatory Visit: Payer: Self-pay | Admitting: Medical

## 2021-07-06 ENCOUNTER — Other Ambulatory Visit: Payer: Self-pay | Admitting: Medical

## 2021-07-08 ENCOUNTER — Ambulatory Visit (INDEPENDENT_AMBULATORY_CARE_PROVIDER_SITE_OTHER): Payer: 59

## 2021-07-08 DIAGNOSIS — I472 Ventricular tachycardia, unspecified: Secondary | ICD-10-CM

## 2021-07-08 LAB — CUP PACEART REMOTE DEVICE CHECK
Battery Remaining Longevity: 32 mo
Battery Remaining Percentage: 31 %
Battery Voltage: 2.87 V
Brady Statistic AP VP Percent: 1 %
Brady Statistic AP VS Percent: 30 %
Brady Statistic AS VP Percent: 1 %
Brady Statistic AS VS Percent: 70 %
Brady Statistic RA Percent Paced: 28 %
Brady Statistic RV Percent Paced: 1 %
Date Time Interrogation Session: 20230425020018
HighPow Impedance: 50 Ohm
HighPow Impedance: 50 Ohm
Implantable Lead Implant Date: 20150910
Implantable Lead Implant Date: 20150910
Implantable Lead Location: 753859
Implantable Lead Location: 753860
Implantable Pulse Generator Implant Date: 20150910
Lead Channel Impedance Value: 390 Ohm
Lead Channel Impedance Value: 490 Ohm
Lead Channel Pacing Threshold Amplitude: 0.5 V
Lead Channel Pacing Threshold Amplitude: 1 V
Lead Channel Pacing Threshold Pulse Width: 0.5 ms
Lead Channel Pacing Threshold Pulse Width: 0.5 ms
Lead Channel Sensing Intrinsic Amplitude: 3.2 mV
Lead Channel Sensing Intrinsic Amplitude: 4.8 mV
Lead Channel Setting Pacing Amplitude: 2 V
Lead Channel Setting Pacing Amplitude: 2.5 V
Lead Channel Setting Pacing Pulse Width: 0.5 ms
Lead Channel Setting Sensing Sensitivity: 0.5 mV
Pulse Gen Serial Number: 7208008

## 2021-07-23 ENCOUNTER — Other Ambulatory Visit: Payer: Self-pay | Admitting: Medical

## 2021-07-23 ENCOUNTER — Telehealth: Payer: Self-pay

## 2021-07-23 MED ORDER — MUPIROCIN 2 % EX OINT: 1.0000 "application " | TOPICAL_OINTMENT | Freq: Two times a day (BID) | CUTANEOUS | 2 refills | Status: DC

## 2021-07-23 NOTE — Telephone Encounter (Signed)
Pt called and wanted to get a refill on a cream that was called in for him last year for a sore in his nose. He cant remember the name of the cream. He was advised that he may need an appt. If not he wants it called into the [facility] on [address]. ?

## 2021-07-24 NOTE — Progress Notes (Signed)
Remote ICD transmission.   

## 2021-08-03 ENCOUNTER — Other Ambulatory Visit: Payer: Self-pay | Admitting: Medical

## 2021-08-07 ENCOUNTER — Ambulatory Visit: Payer: 59 | Admitting: Cardiology

## 2021-08-07 ENCOUNTER — Encounter: Payer: Self-pay | Admitting: Cardiology

## 2021-08-07 VITALS — BP 108/66 | HR 61 | Ht 72.0 in | Wt 222.0 lb

## 2021-08-07 DIAGNOSIS — I422 Other hypertrophic cardiomyopathy: Secondary | ICD-10-CM
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
## 2021-08-30 ENCOUNTER — Other Ambulatory Visit: Payer: Self-pay | Admitting: Medical

## 2021-09-24 ENCOUNTER — Ambulatory Visit: Payer: 59 | Admitting: Cardiology

## 2021-09-24 ENCOUNTER — Encounter: Payer: Self-pay | Admitting: Cardiology

## 2021-09-24 VITALS — BP 102/78 | HR 62 | Ht 72.0 in | Wt 215.0 lb

## 2021-09-24 DIAGNOSIS — Z95810 Presence of automatic (implantable) cardiac defibrillator: Secondary | ICD-10-CM

## 2021-09-24 DIAGNOSIS — I10 Essential (primary) hypertension: Secondary | ICD-10-CM | POA: Diagnosis not present

## 2021-09-24 DIAGNOSIS — I422 Other hypertrophic cardiomyopathy: Secondary | ICD-10-CM

## 2021-09-24 DIAGNOSIS — E1165 Type 2 diabetes mellitus with hyperglycemia: Secondary | ICD-10-CM | POA: Diagnosis not present

## 2021-09-24 NOTE — Patient Instructions (Signed)
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
## 2021-09-24 NOTE — Addendum Note (Signed)
Addended by: Jacobo Forest D on: 09/24/2021 08:58 AM   Modules accepted: Orders

## 2021-10-03 ENCOUNTER — Telehealth: Payer: Self-pay | Admitting: Medical

## 2021-10-03 NOTE — Telephone Encounter (Signed)
P.A. VASCEPA RENEWAL  

## 2021-10-04 ENCOUNTER — Other Ambulatory Visit: Payer: Self-pay | Admitting: Medical

## 2021-10-07 ENCOUNTER — Ambulatory Visit (INDEPENDENT_AMBULATORY_CARE_PROVIDER_SITE_OTHER): Payer: 59

## 2021-10-07 DIAGNOSIS — I422 Other hypertrophic cardiomyopathy: Secondary | ICD-10-CM | POA: Diagnosis not present

## 2021-10-07 NOTE — Telephone Encounter (Signed)
Lvm for pt to call and schedule a diabetes check . Nathan [name]

## 2021-10-08 ENCOUNTER — Other Ambulatory Visit: Payer: Self-pay | Admitting: Medical

## 2021-10-08 LAB — CUP PACEART REMOTE DEVICE CHECK
Battery Remaining Longevity: 30 mo
Battery Remaining Percentage: 29 %
Battery Voltage: 2.87 V
Brady Statistic AP VP Percent: 1 %
Brady Statistic AP VS Percent: 27 %
Brady Statistic AS VP Percent: 1 %
Brady Statistic AS VS Percent: 73 %
Brady Statistic RA Percent Paced: 25 %
Brady Statistic RV Percent Paced: 1 %
Date Time Interrogation Session: 20230725020015
HighPow Impedance: 46 Ohm
HighPow Impedance: 46 Ohm
Implantable Lead Implant Date: 20150910
Implantable Lead Implant Date: 20150910
Implantable Lead Location: 753859
Implantable Lead Location: 753860
Implantable Pulse Generator Implant Date: 20150910
Lead Channel Impedance Value: 360 Ohm
Lead Channel Impedance Value: 480 Ohm
Lead Channel Pacing Threshold Amplitude: 0.75 V
Lead Channel Pacing Threshold Amplitude: 1.25 V
Lead Channel Pacing Threshold Pulse Width: 0.5 ms
Lead Channel Pacing Threshold Pulse Width: 0.5 ms
Lead Channel Sensing Intrinsic Amplitude: 3.4 mV
Lead Channel Sensing Intrinsic Amplitude: 7.2 mV
Lead Channel Setting Pacing Amplitude: 2 V
Lead Channel Setting Pacing Amplitude: 2.5 V
Lead Channel Setting Pacing Pulse Width: 0.5 ms
Lead Channel Setting Sensing Sensitivity: 0.5 mV
Pulse Gen Serial Number: 7208008

## 2021-10-08 NOTE — Telephone Encounter (Signed)
Left pt a vm that he needs to schedule diabetes check

## 2021-10-14 ENCOUNTER — Ambulatory Visit (HOSPITAL_BASED_OUTPATIENT_CLINIC_OR_DEPARTMENT_OTHER): Payer: 59

## 2021-10-21 ENCOUNTER — Other Ambulatory Visit: Payer: Self-pay | Admitting: Medical

## 2021-10-21 NOTE — Telephone Encounter (Signed)
Left pt a vm to see if refill is needed

## 2021-10-22 NOTE — Telephone Encounter (Signed)
P.A. Dalene Seltzer approved til 10/04/22

## 2021-11-04 ENCOUNTER — Telehealth: Payer: Self-pay | Admitting: Medical

## 2021-11-04 ENCOUNTER — Ambulatory Visit: Payer: 59 | Admitting: Medical

## 2021-11-04 ENCOUNTER — Other Ambulatory Visit: Payer: Self-pay | Admitting: Medical

## 2021-11-04 VITALS — BP 122/62 | HR 68 | Wt 210.0 lb

## 2021-11-04 DIAGNOSIS — E785 Hyperlipidemia, unspecified: Secondary | ICD-10-CM | POA: Diagnosis not present

## 2021-11-04 DIAGNOSIS — I10 Essential (primary) hypertension: Secondary | ICD-10-CM | POA: Diagnosis not present

## 2021-11-04 DIAGNOSIS — I422 Other hypertrophic cardiomyopathy: Secondary | ICD-10-CM

## 2021-11-04 DIAGNOSIS — Z95810 Presence of automatic (implantable) cardiac defibrillator: Secondary | ICD-10-CM | POA: Diagnosis not present

## 2021-11-04 DIAGNOSIS — E1165 Type 2 diabetes mellitus with hyperglycemia: Secondary | ICD-10-CM | POA: Diagnosis not present

## 2021-11-04 DIAGNOSIS — Z125 Encounter for screening for malignant neoplasm of prostate: Secondary | ICD-10-CM

## 2021-11-04 DIAGNOSIS — Z7185 Encounter for immunization safety counseling: Secondary | ICD-10-CM

## 2021-11-04 NOTE — Telephone Encounter (Signed)
They will be pulled now and sent over

## 2021-11-04 NOTE — Progress Notes (Signed)
Remote ICD transmission.   

## 2021-11-04 NOTE — Telephone Encounter (Signed)
Pt stated at check out he was due for his tetanus shot and wanted to come in next month for it along with his flu shot, is this ok?
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
## 2021-11-04 NOTE — Telephone Encounter (Signed)
Please call [facility] about recent labs.  Get copies sent to [name].  He is in a study there

## 2021-11-05 ENCOUNTER — Other Ambulatory Visit: Payer: Self-pay | Admitting: Medical

## 2021-11-05 ENCOUNTER — Other Ambulatory Visit: Payer: Self-pay | Admitting: Cardiology

## 2021-11-05 LAB — BASIC METABOLIC PANEL
BUN/Creatinine Ratio: 17 (ref 10–24)
BUN: 16 mg/dL (ref 8–27)
CO2: 22 mmol/L (ref 20–29)
Calcium: 9 mg/dL (ref 8.6–10.2)
Chloride: 102 mmol/L (ref 96–106)
Creatinine, Ser: 0.95 mg/dL (ref 0.76–1.27)
Glucose: 96 mg/dL (ref 70–99)
Potassium: 4.2 mmol/L (ref 3.5–5.2)
Sodium: 140 mmol/L (ref 134–144)
eGFR: 90 mL/min/{1.73_m2} (ref >59)

## 2021-11-05 LAB — HEMOGLOBIN A1C
Est. average glucose Bld gHb Est-mCnc: 120 mg/dL
Hgb A1c MFr Bld: 5.8 % — ABNORMAL HIGH (ref 4.8–5.6)

## 2021-11-05 LAB — PSA: Prostate Specific Ag, Serum: 0.7 ng/mL (ref 0.0–4.0)

## 2021-11-05 MED ORDER — EMPAGLIFLOZIN 10 MG PO TABS: ORAL_TABLET | ORAL | 3 refills | Status: DC

## 2021-11-05 MED ORDER — METFORMIN HCL ER 750 MG PO TB24: ORAL_TABLET | ORAL | 3 refills | Status: DC

## 2021-11-05 MED ORDER — ROSUVASTATIN CALCIUM 20 MG PO TABS
20.0000 mg | ORAL_TABLET | Freq: Every day | ORAL | 3 refills | Status: DC
Start: 2021-11-05 — End: 2022-11-09

## 2021-11-05 MED ORDER — VASCEPA 1 G PO CAPS: ORAL_CAPSULE | ORAL | 3 refills | Status: DC

## 2021-11-05 MED ORDER — ASPIRIN 81 MG PO TBEC: 81.0000 mg | DELAYED_RELEASE_TABLET | Freq: Every day | ORAL | 3 refills | Status: DC

## 2021-11-18 ENCOUNTER — Other Ambulatory Visit (HOSPITAL_BASED_OUTPATIENT_CLINIC_OR_DEPARTMENT_OTHER): Payer: 59

## 2021-11-21 ENCOUNTER — Ambulatory Visit (HOSPITAL_BASED_OUTPATIENT_CLINIC_OR_DEPARTMENT_OTHER)
Admission: RE | Admit: 2021-11-21 | Discharge: 2021-11-21 | Disposition: A | Discharge status: Home / Self Care | Payer: 59 | Source: Ambulatory Visit | Attending: Cardiology | Admitting: Cardiology

## 2021-11-21 DIAGNOSIS — I422 Other hypertrophic cardiomyopathy: Secondary | ICD-10-CM | POA: Diagnosis present

## 2021-11-21 LAB — ECHOCARDIOGRAM COMPLETE
AR max vel: 2.92 cm2
AV Area VTI: 2.87 cm2
AV Area mean vel: 3.01 cm2
AV Mean grad: 3 mmHg
AV Peak grad: 6 mmHg
Ao pk vel: 1.22 m/s
Area-P 1/2: 3.21 cm2
S' Lateral: 4.2 cm

## 2021-11-21 IMAGING — CR DG CHEST 2V
2 series · 2 of 2 positions shown · non-contrast
Comparison: 11/24/2013

CLINICAL DATA: Chest pain with pleuritic component and diaphoresis.

EXAM:
CHEST - 2 VIEW

[chest lat]
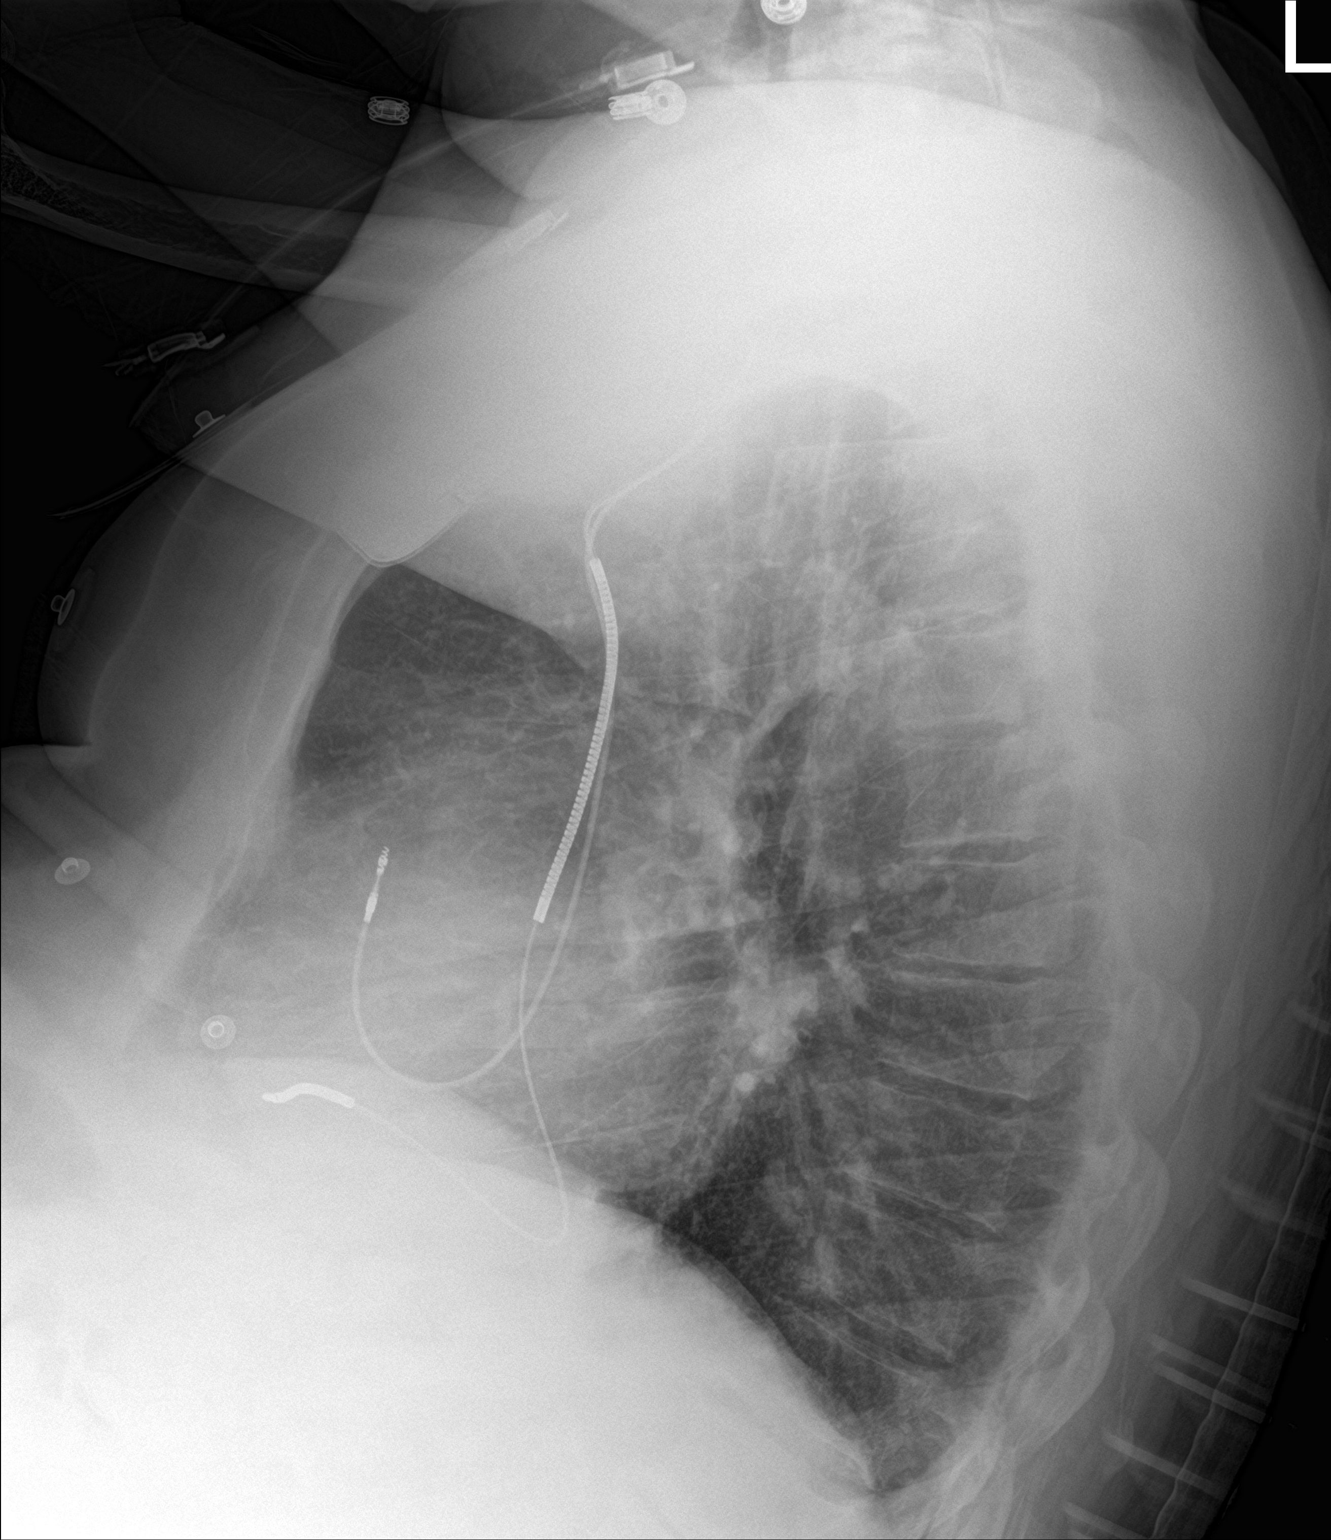

[chest ap]
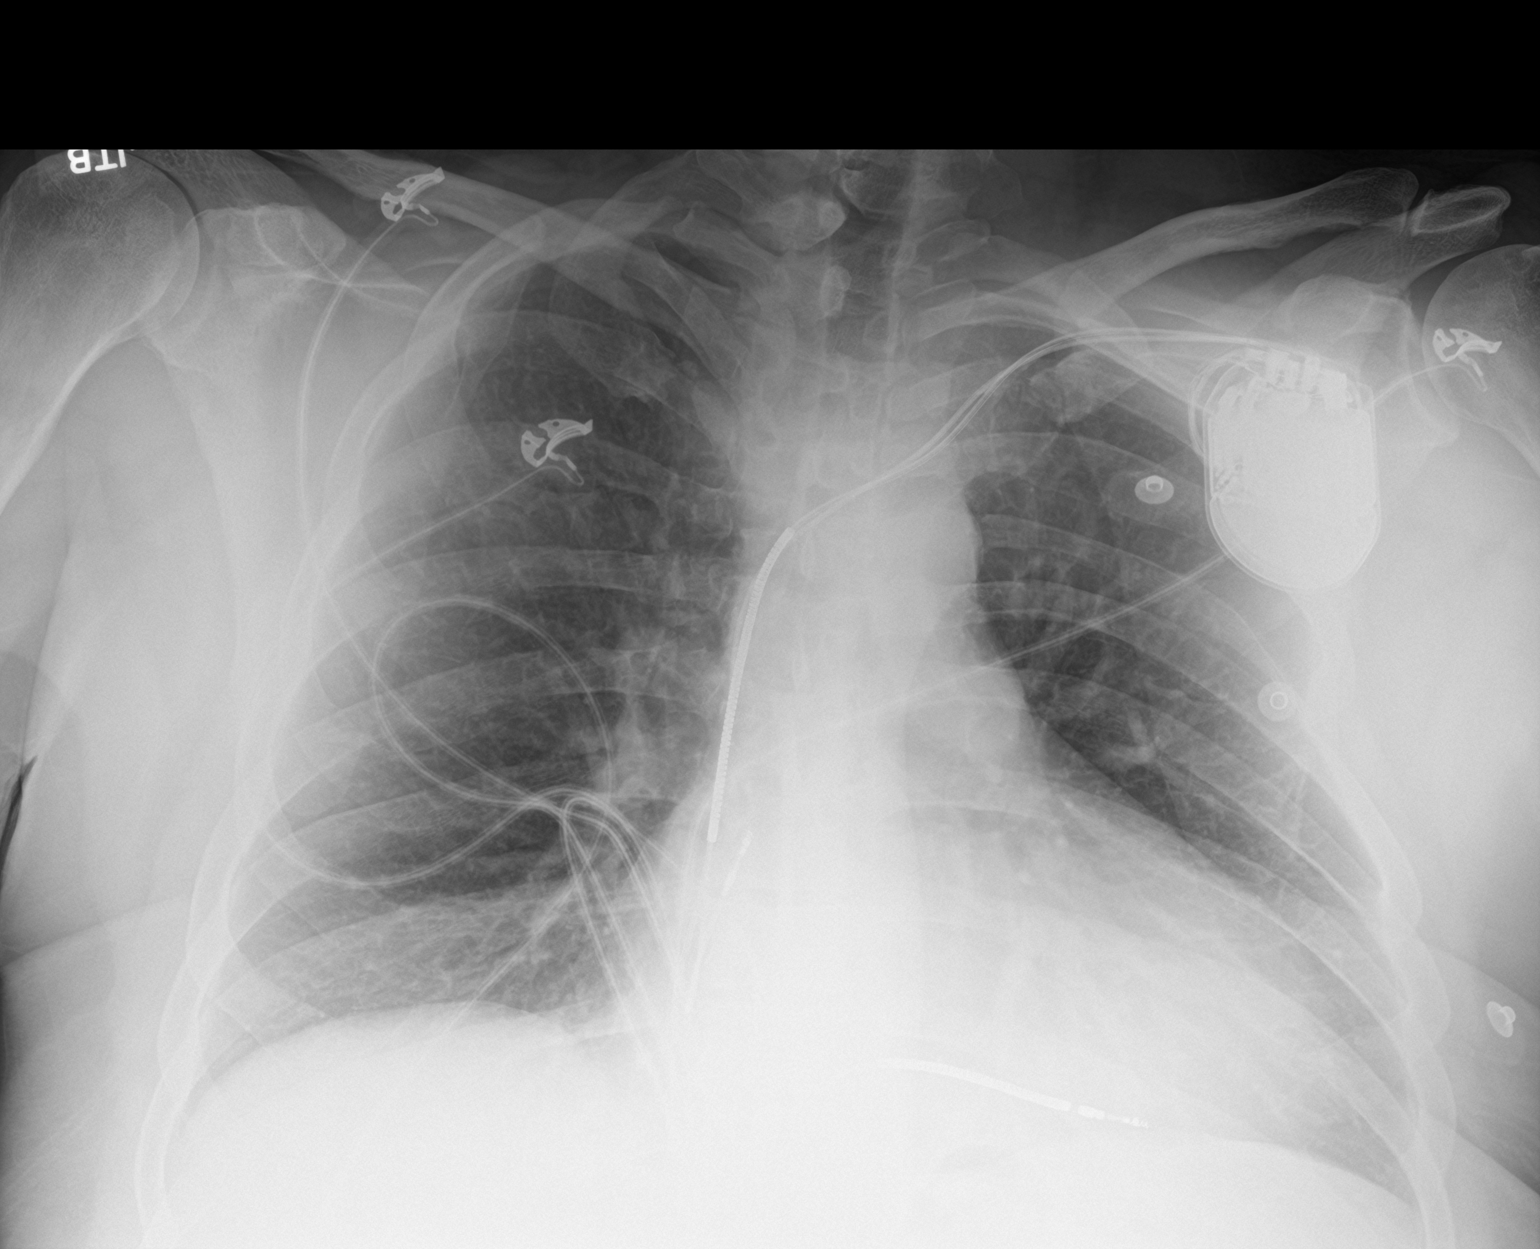

[2 of 2 positions shown; findings below may reference images not displayed]

FINDINGS: Left-sided pacemaker unchanged. Lungs are adequately inflated
without focal airspace consolidation or effusion. Mild stable
cardiomegaly. Remainder of the exam is unchanged.
IMPRESSION: No acute cardiopulmonary disease.

Mild stable cardiomegaly.

## 2021-11-21 MED ORDER — PERFLUTREN LIPID MICROSPHERE
1.0000 mL | INTRAVENOUS | Status: AC | PRN
  Administered 2021-11-21: 2 mL via INTRAVENOUS

## 2021-11-26 ENCOUNTER — Other Ambulatory Visit (INDEPENDENT_AMBULATORY_CARE_PROVIDER_SITE_OTHER): Payer: 59

## 2021-11-26 DIAGNOSIS — Z23 Encounter for immunization: Secondary | ICD-10-CM | POA: Diagnosis not present

## 2021-11-27 ENCOUNTER — Telehealth: Payer: Self-pay | Admitting: Cardiology

## 2021-11-27 NOTE — Telephone Encounter (Signed)
Patient calling for echo results 

## 2021-12-03 ENCOUNTER — Telehealth: Payer: Self-pay

## 2021-12-03 NOTE — Telephone Encounter (Signed)
-----   Message from Park Liter, MD sent at 12/02/2021 10:27 AM EDT ----- Echocardiogram showed known hypertrophic cardiomyopathy.  Patient need to wear Zio patch for 2 weeks make sure that he does not have any significant arrhythmia

## 2021-12-03 NOTE — Telephone Encounter (Signed)
Patient returned CMA's call. 

## 2021-12-03 NOTE — Telephone Encounter (Signed)
Because the patient has a defibrillator,as far as his hypertrophic cardiomyopathy per Dr. Agustin Cree we simply need to monitor this and repeat Echo as needed no need for Zio patch. Patient notified

## 2021-12-03 NOTE — Telephone Encounter (Signed)
Patient had bad reception and stated he will call us back when he is in a better location

## 2021-12-04 ENCOUNTER — Telehealth: Payer: Self-pay | Admitting: Cardiology

## 2021-12-04 NOTE — Telephone Encounter (Signed)
Patient calling in saying he need clarification on his echo. Please Advise

## 2021-12-04 NOTE — Telephone Encounter (Signed)
Spoke with pt who states that he has questions regarding his recent echo compared to echo 1/23. Advised of both echos. Pt verbalized understanding and had no additional questions.

## 2022-01-06 ENCOUNTER — Ambulatory Visit (INDEPENDENT_AMBULATORY_CARE_PROVIDER_SITE_OTHER): Payer: 59

## 2022-01-06 DIAGNOSIS — I422 Other hypertrophic cardiomyopathy: Secondary | ICD-10-CM | POA: Diagnosis not present

## 2022-01-08 LAB — CUP PACEART REMOTE DEVICE CHECK
Battery Remaining Longevity: 29 mo
Battery Remaining Percentage: 28 %
Battery Voltage: 2.86 V
Brady Statistic AP VP Percent: 1 %
Brady Statistic AP VS Percent: 24 %
Brady Statistic AS VP Percent: 1 %
Brady Statistic AS VS Percent: 76 %
Brady Statistic RA Percent Paced: 23 %
Brady Statistic RV Percent Paced: 1 %
Date Time Interrogation Session: 20231024020015
HighPow Impedance: 48 Ohm
HighPow Impedance: 48 Ohm
Implantable Lead Connection Status: 753985
Implantable Lead Connection Status: 753985
Implantable Lead Implant Date: 20150910
Implantable Lead Implant Date: 20150910
Implantable Lead Location: 753859
Implantable Lead Location: 753860
Implantable Pulse Generator Implant Date: 20150910
Lead Channel Impedance Value: 400 Ohm
Lead Channel Impedance Value: 480 Ohm
Lead Channel Pacing Threshold Amplitude: 0.75 V
Lead Channel Pacing Threshold Amplitude: 1.25 V
Lead Channel Pacing Threshold Pulse Width: 0.5 ms
Lead Channel Pacing Threshold Pulse Width: 0.5 ms
Lead Channel Sensing Intrinsic Amplitude: 3.4 mV
Lead Channel Sensing Intrinsic Amplitude: 8.1 mV
Lead Channel Setting Pacing Amplitude: 2 V
Lead Channel Setting Pacing Amplitude: 2.5 V
Lead Channel Setting Pacing Pulse Width: 0.5 ms
Lead Channel Setting Sensing Sensitivity: 0.5 mV
Pulse Gen Serial Number: 7208008

## 2022-01-26 NOTE — Progress Notes (Signed)
Remote ICD transmission.   

## 2022-02-07 ENCOUNTER — Other Ambulatory Visit: Payer: Self-pay | Admitting: Cardiology

## 2022-02-09 NOTE — Telephone Encounter (Signed)
Rx refill sent to pharmacy. 

## 2022-04-07 ENCOUNTER — Ambulatory Visit: Payer: 59 | Attending: Cardiovascular Disease

## 2022-04-07 DIAGNOSIS — I422 Other hypertrophic cardiomyopathy: Secondary | ICD-10-CM | POA: Diagnosis not present

## 2022-04-07 LAB — CUP PACEART REMOTE DEVICE CHECK
Battery Remaining Longevity: 29 mo
Battery Remaining Percentage: 28 %
Battery Voltage: 2.86 V
Brady Statistic AP VP Percent: 1 %
Brady Statistic AP VS Percent: 22 %
Brady Statistic AS VP Percent: 1 %
Brady Statistic AS VS Percent: 78 %
Brady Statistic RA Percent Paced: 21 %
Brady Statistic RV Percent Paced: 1 %
Date Time Interrogation Session: 20240123020017
HighPow Impedance: 47 Ohm
HighPow Impedance: 48 Ohm
Implantable Lead Connection Status: 753985
Implantable Lead Connection Status: 753985
Implantable Lead Implant Date: 20150910
Implantable Lead Implant Date: 20150910
Implantable Lead Location: 753859
Implantable Lead Location: 753860
Implantable Pulse Generator Implant Date: 20150910
Lead Channel Impedance Value: 400 Ohm
Lead Channel Impedance Value: 480 Ohm
Lead Channel Pacing Threshold Amplitude: 0.75 V
Lead Channel Pacing Threshold Amplitude: 1.25 V
Lead Channel Pacing Threshold Pulse Width: 0.5 ms
Lead Channel Pacing Threshold Pulse Width: 0.5 ms
Lead Channel Sensing Intrinsic Amplitude: 4.7 mV
Lead Channel Sensing Intrinsic Amplitude: 8.3 mV
Lead Channel Setting Pacing Amplitude: 2 V
Lead Channel Setting Pacing Amplitude: 2.5 V
Lead Channel Setting Pacing Pulse Width: 0.5 ms
Lead Channel Setting Sensing Sensitivity: 0.5 mV
Pulse Gen Serial Number: 7208008

## 2022-04-27 ENCOUNTER — Telehealth: Payer: Self-pay | Admitting: Cardiology

## 2022-04-27 NOTE — Telephone Encounter (Signed)
Spoke with pt. He was wanting to know if he could take Melatonin to help with sleep. Advised to follow the directions on the bottle and to consult with the pharmacist if he has any questions.

## 2022-04-27 NOTE — Telephone Encounter (Signed)
Pt c/o medication issue:  1. Name of Medication: melatonin  2. How are you currently taking this medication (dosage and times per day)?   3. Are you having a reaction (difficulty breathing--STAT)?   4. What is your medication issue? Patient states he is having a lot of trouble sleeping lately and he wants to know if he can take melatonin.

## 2022-05-05 NOTE — Progress Notes (Signed)
Remote ICD transmission.   

## 2022-07-07 ENCOUNTER — Ambulatory Visit (INDEPENDENT_AMBULATORY_CARE_PROVIDER_SITE_OTHER): Payer: 59

## 2022-07-07 DIAGNOSIS — I422 Other hypertrophic cardiomyopathy: Secondary | ICD-10-CM

## 2022-07-08 LAB — CUP PACEART REMOTE DEVICE CHECK
Battery Remaining Longevity: 28 mo
Battery Remaining Percentage: 27 %
Battery Voltage: 2.83 V
Brady Statistic AP VP Percent: 1 %
Brady Statistic AP VS Percent: 20 %
Brady Statistic AS VP Percent: 1 %
Brady Statistic AS VS Percent: 79 %
Brady Statistic RA Percent Paced: 19 %
Brady Statistic RV Percent Paced: 1 %
Date Time Interrogation Session: 20240423020026
HighPow Impedance: 47 Ohm
HighPow Impedance: 47 Ohm
Implantable Lead Connection Status: 753985
Implantable Lead Connection Status: 753985
Implantable Lead Implant Date: 20150910
Implantable Lead Implant Date: 20150910
Implantable Lead Location: 753859
Implantable Lead Location: 753860
Implantable Pulse Generator Implant Date: 20150910
Lead Channel Impedance Value: 400 Ohm
Lead Channel Impedance Value: 480 Ohm
Lead Channel Pacing Threshold Amplitude: 0.75 V
Lead Channel Pacing Threshold Amplitude: 1.25 V
Lead Channel Pacing Threshold Pulse Width: 0.5 ms
Lead Channel Pacing Threshold Pulse Width: 0.5 ms
Lead Channel Sensing Intrinsic Amplitude: 10.7 mV
Lead Channel Sensing Intrinsic Amplitude: 4.2 mV
Lead Channel Setting Pacing Amplitude: 2 V
Lead Channel Setting Pacing Amplitude: 2.5 V
Lead Channel Setting Pacing Pulse Width: 0.5 ms
Lead Channel Setting Sensing Sensitivity: 0.5 mV
Pulse Gen Serial Number: 7208008

## 2022-07-14 ENCOUNTER — Ambulatory Visit: Payer: 59 | Attending: Cardiology | Admitting: Cardiology

## 2022-07-14 ENCOUNTER — Encounter: Payer: Self-pay | Admitting: Cardiology

## 2022-07-14 VITALS — BP 98/68 | HR 66 | Ht 72.0 in | Wt 190.0 lb

## 2022-07-14 DIAGNOSIS — I472 Ventricular tachycardia, unspecified: Secondary | ICD-10-CM | POA: Diagnosis not present

## 2022-07-14 DIAGNOSIS — I10 Essential (primary) hypertension: Secondary | ICD-10-CM | POA: Diagnosis not present

## 2022-07-14 DIAGNOSIS — E785 Hyperlipidemia, unspecified: Secondary | ICD-10-CM

## 2022-07-14 DIAGNOSIS — I422 Other hypertrophic cardiomyopathy: Secondary | ICD-10-CM

## 2022-07-14 DIAGNOSIS — E1165 Type 2 diabetes mellitus with hyperglycemia: Secondary | ICD-10-CM | POA: Diagnosis not present
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
## 2022-07-14 NOTE — Patient Instructions (Signed)

## 2022-08-05 NOTE — Progress Notes (Signed)
Remote ICD transmission.   

## 2022-08-11 ENCOUNTER — Telehealth: Payer: Self-pay | Admitting: Medical

## 2022-08-11 NOTE — Telephone Encounter (Signed)
Pt called to schedule a appt with Vincenza Hews for Thursday. While on the phone he stated he had a after hour call with Dr. Lynelle Doctor last week concerning same issue. I do not see notes for that call. Not sure if he was mistaken about provider but no after hour call is referenced in his chart. Per Lafonda Mosses sending a note to Dr. Lynelle Doctor to see if she knows anything about a after hour conversation with pt concerning constipation so she may figure out what happened to documentation.

## 2022-08-13 ENCOUNTER — Telehealth: Payer: Self-pay | Admitting: Medical

## 2022-08-13 ENCOUNTER — Ambulatory Visit: Payer: 59 | Admitting: Medical

## 2022-08-13 VITALS — BP 120/80 | HR 63 | Wt 190.0 lb

## 2022-08-13 DIAGNOSIS — R140 Abdominal distension (gaseous): Secondary | ICD-10-CM | POA: Diagnosis not present

## 2022-08-13 DIAGNOSIS — K5903 Drug induced constipation: Secondary | ICD-10-CM | POA: Diagnosis not present

## 2022-08-13 DIAGNOSIS — K4090 Unilateral inguinal hernia, without obstruction or gangrene, not specified as recurrent: Secondary | ICD-10-CM

## 2022-08-13 NOTE — Patient Instructions (Signed)
Recommendations given ongoing bloating and constipation: Increase to 80+ ounces of water daily Continue Metamucil fiber supplement Begin back on Miralax 1 cap full or 1 dose daily to help with constipation prevention Get ruffage in the diet such as greens/lettuce Ask study about possibility of using either Trulance, Linzess or Amitiz prescription medicaiton or probiotic.  This would be an alternate to Miralax Another option is to ask study about possibility of different arm/lower dose in the study, assuming you are on higher dose of ozempic Avoid gas producing foods, avoid big portions of meat and limit cheese     Left inguinal hernia   Inguinal Hernia, Adult An inguinal hernia develops when fat or the intestines push through a weak spot in a muscle where the leg meets the lower abdomen (groin). This creates a bulge. This kind of hernia could also be: In the scrotum, if you are male. In folds of skin around the vagina, if you are male. There are three types of inguinal hernias: Hernias that can be pushed back into the abdomen (are reducible). This type rarely causes pain. Hernias that are not reducible (are incarcerated). Hernias that are not reducible and lose their blood supply (are strangulated). This type of hernia requires emergency surgery. What are the causes? This condition is caused by having a weak spot in the muscles or tissues in your groin. This develops over time. The hernia may poke through the weak spot when you suddenly strain your lower abdominal muscles, such as when you: Lift a heavy object. Strain to have a bowel movement. Constipation can lead to straining. Cough. What increases the risk? This condition is more likely to develop in: Males. Pregnant females. People who: Are overweight. Work in jobs that require long periods of standing or heavy lifting. Have had an inguinal hernia before. Smoke or have lung disease. These factors can lead to long-term  (chronic) coughing. What are the signs or symptoms? Symptoms may depend on the size of the hernia. Often, a small inguinal hernia has no symptoms. Symptoms of a larger hernia may include: A bulge in the groin area. This is easier to see when standing. It might not be visible when lying down. Pain or burning in the groin. This may get worse when lifting, straining, or coughing. A dull ache or a feeling of pressure in the groin. An unusual bulge in the scrotum, in males. Symptoms of a strangulated inguinal hernia may include: A bulge in your groin that is very painful and tender to the touch. A bulge that turns red or purple. Fever, nausea, and vomiting. Inability to have a bowel movement or to pass gas. How is this diagnosed? This condition is diagnosed based on your symptoms, your medical history, and a physical exam. Your health care provider may feel your groin area and ask you to cough. How is this treated? Treatment depends on the size of your hernia and whether you have symptoms. If you do not have symptoms, your health care provider may have you watch your hernia carefully and have you come in for follow-up visits. If your hernia is large or if you have symptoms, you may need surgery to repair the hernia. Follow these instructions at home: Lifestyle Avoid lifting heavy objects. Avoid standing for long periods of time. Do not use any products that contain nicotine or tobacco. These products include cigarettes, chewing tobacco, and vaping devices, such as e-cigarettes. If you need help quitting, ask your health care provider. Maintain a healthy weight.  Preventing constipation You may need to take these actions to prevent or treat constipation: Drink enough fluid to keep your urine pale yellow. Take over-the-counter or prescription medicines. Eat foods that are high in fiber, such as beans, whole grains, and fresh fruits and vegetables. Limit foods that are high in fat and processed  sugars, such as fried or sweet foods. General instructions You may try to push the hernia back in place by very gently pressing on it while lying down. Do not try to force the bulge back in if it will not push in easily. Watch your hernia for any changes in shape, size, or color. Get help right away if you notice any changes. Take over-the-counter and prescription medicines only as told by your health care provider. Keep all follow-up visits. This is important. Contact a health care provider if: You have a fever or chills. You develop new symptoms. Your symptoms get worse. Get help right away if: You have pain in your groin that suddenly gets worse. You have a bulge in your groin that: Suddenly gets bigger and does not get smaller. Becomes red or purple or painful to the touch. You are a man and you have a sudden pain in your scrotum, or the size of your scrotum suddenly changes. You cannot push the hernia back in place by very gently pressing on it when you are lying down. You have nausea or vomiting that does not go away. You have a fast heartbeat. You cannot have a bowel movement or pass gas. These symptoms may represent a serious problem that is an emergency. Do not wait to see if the symptoms will go away. Get medical help right away. Call your local emergency services (911 in the [place] Summary An inguinal hernia develops when fat or the intestines push through a weak spot in a muscle where your leg meets your lower abdomen (groin). This condition is caused by having a weak spot in muscles or tissues in your groin. Symptoms may depend on the size of the hernia, and they may include pain or swelling in your groin. A small inguinal hernia often has no symptoms. Treatment may not be needed if you do not have symptoms. If you have symptoms or a large hernia, you may need surgery to repair the hernia. Avoid lifting heavy objects. Also, avoid standing for long periods of time. This  information is not intended to replace advice given to you by your health care provider. Make sure you discuss any questions you have with your health care provider. Document Revised: 10/31/2019 Document Reviewed: 10/31/2019 Elsevier Patient Education  2024 [employer].

## 2022-08-13 NOTE — Telephone Encounter (Signed)
Pt called and that the he contacted the medical study he is involved with and was advised that the Trulance he was given today was ok. Pt states he was given samples today. He also states he uses [facility] on [address]. Pt can be reached at [phone].
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
## 2022-08-13 NOTE — Telephone Encounter (Signed)
Pt called back and states that trulance is not covered by his insurance he did not find out what was covered

## 2022-08-14 NOTE — Telephone Encounter (Signed)
Pt states things are starting to move around and doing better.  He wanted me to let you know that his hernia goes away at night but when he gets up in the morning and starts moving around then it will comes back.

## 2022-08-14 NOTE — Telephone Encounter (Signed)
Pt was notified.  Pt states he is not having pain just sometimes uncomfortable but will let us know if he wants that referral

## 2022-08-17 ENCOUNTER — Telehealth: Payer: Self-pay | Admitting: Medical

## 2022-08-17 ENCOUNTER — Other Ambulatory Visit: Payer: Self-pay | Admitting: Medical

## 2022-08-17 DIAGNOSIS — K4090 Unilateral inguinal hernia, without obstruction or gangrene, not specified as recurrent: Secondary | ICD-10-CM

## 2022-08-17 NOTE — Telephone Encounter (Signed)
Pt called & states he has a double hernia & wants to proceed & wants to know what the next steps are?

## 2022-08-18 NOTE — Telephone Encounter (Signed)
Pt informed

## 2022-08-31 ENCOUNTER — Ambulatory Visit: Payer: Self-pay | Admitting: Surgery

## 2022-08-31 DIAGNOSIS — K4020 Bilateral inguinal hernia, without obstruction or gangrene, not specified as recurrent: Secondary | ICD-10-CM | POA: Insufficient documentation

## 2022-09-01 ENCOUNTER — Encounter: Payer: Self-pay | Admitting: Cardiology

## 2022-09-01 ENCOUNTER — Telehealth: Payer: Self-pay | Admitting: *Deleted

## 2022-09-01 NOTE — Telephone Encounter (Signed)
   Name: Nathan [name]  DOB: 10/13/1960  MRN: [id number]   Primary Cardiologist: None  Chart reviewed as part of pre-operative protocol coverage. Patient was contacted 09/01/2022 in reference to pre-operative risk assessment for pending surgery as outlined below.  [name] was last seen on 07/14/2022 by Dr. Bing Matter.  Since that day, [name] has done well from a cardiac standpoint.  He denies any new symptoms or concerns. He is able to complete greater than 4 METS without difficulty.  Therefore, based on ACC/AHA guidelines, the patient would be at acceptable risk for the planned procedure without further cardiovascular testing.   Per office protocol, he may hold Aspirin for 5-7 days prior to procedure. Please resume Aspirin as soon as possible postprocedure, at the discretion of the surgeon.   The patient was advised that if he develops new symptoms prior to surgery to contact our office to arrange for a follow-up visit, and he verbalized understanding.  I will route this recommendation to the requesting party via Epic fax function and remove from pre-op pool. Please call with questions.  Joylene Grapes, NP 09/01/2022, 4:49 PM

## 2022-09-01 NOTE — Progress Notes (Signed)
PERIOPERATIVE PRESCRIPTION FOR IMPLANTED CARDIAC DEVICE PROGRAMMING  Patient Information: Name:  Umair Graddick  DOB:  02/24/1961  MRN:  [id number]    rocedure:   Lap, Bilateral Inguinal Hernia Repair w/mesh.   Date of Surgery:  Clearance TBD                                 Surgeon:  Murlean Iba Surgeon's Group or Practice Name:  [facility] Phone number:  [phone] Fax number:  [phone]   Type of Clearance Requested:   - Medical  - Pharmacy:  Hold Aspirin Not Indicated   Device Information:  Clinic EP Physician:  Loman Brooklyn, MD   Device Type:  Pacemaker and Defibrillator Manufacturer and Phone #:  St. Jude/Abbott: [phone] Pacemaker Dependent?:  No. Date of Last Device Check:  07/07/22 Normal Device Function?:  Yes.    Electrophysiologist's Recommendations:  Have magnet available. Provide continuous ECG monitoring when magnet is used or reprogramming is to be performed.  Procedure may interfere with device function.  Magnet should be placed over device during procedure.  Per Device Clinic Standing Orders, Dorathy Daft, RN  3:42 PM 09/01/2022

## 2022-09-01 NOTE — Telephone Encounter (Signed)
   Pre-operative Risk Assessment    Patient Name: Nathan [name]  DOB: Jul 02, 1960 MRN: [id number]      Request for Surgical Clearance    Procedure:   Lap, Bilateral Inguinal Hernia Repair w/mesh.  Date of Surgery:  Clearance TBD                                 Surgeon:  Murlean Iba Surgeon's Group or Practice Name:  [facility] Phone number:  [phone] Fax number:  [phone]   Type of Clearance Requested:   - Medical  - Pharmacy:  Hold Aspirin Not Indicated   Type of Anesthesia:  Not Indicated   Additional requests/questions:    Signed, Emmit Pomfret   09/01/2022, 3:05 PM

## 2022-09-03 NOTE — Telephone Encounter (Signed)
Device clearance sent yesterday.  However, I did re-fax to specified [facility] number today just in case.   Thanks.

## 2022-09-03 NOTE — Telephone Encounter (Signed)
Will forward this to our device clinic to address device clearance.

## 2022-09-03 NOTE — Telephone Encounter (Signed)
Office calling back for clarification on patient's defibrillator. Please advise

## 2022-09-15 ENCOUNTER — Telehealth: Payer: Self-pay | Admitting: Internal Medicine

## 2022-09-15 NOTE — Telephone Encounter (Signed)
P.A for Tillman Sers has been sent through covermymeds. Waiting on response

## 2022-10-06 ENCOUNTER — Ambulatory Visit (INDEPENDENT_AMBULATORY_CARE_PROVIDER_SITE_OTHER): Payer: 59

## 2022-10-06 DIAGNOSIS — I472 Ventricular tachycardia, unspecified: Secondary | ICD-10-CM | POA: Diagnosis not present

## 2022-10-08 LAB — CUP PACEART REMOTE DEVICE CHECK
Battery Remaining Longevity: 26 mo
Battery Remaining Percentage: 25 %
Battery Voltage: 2.81 V
Brady Statistic AP VS Percent: 19 %
Brady Statistic AS VP Percent: 1 %
Brady Statistic AS VS Percent: 81 %
Brady Statistic RA Percent Paced: 18 %
Brady Statistic RV Percent Paced: 1 %
Date Time Interrogation Session: 20240723020017
HighPow Impedance: 47 Ohm
HighPow Impedance: 47 Ohm
Implantable Lead Connection Status: 753985
Implantable Lead Connection Status: 753985
Implantable Lead Implant Date: 20150910
Implantable Lead Implant Date: 20150910
Implantable Lead Location: 753859
Implantable Lead Location: 753860
Implantable Pulse Generator Implant Date: 20150910
Lead Channel Impedance Value: 410 Ohm
Lead Channel Impedance Value: 480 Ohm
Lead Channel Pacing Threshold Amplitude: 0.75 V
Lead Channel Pacing Threshold Amplitude: 1.25 V
Lead Channel Pacing Threshold Pulse Width: 0.5 ms
Lead Channel Sensing Intrinsic Amplitude: 11.1 mV
Lead Channel Sensing Intrinsic Amplitude: 4.9 mV
Lead Channel Setting Pacing Amplitude: 2 V
Lead Channel Setting Pacing Amplitude: 2.5 V
Lead Channel Setting Pacing Pulse Width: 0.5 ms
Lead Channel Setting Sensing Sensitivity: 0.5 mV

## 2022-10-15 ENCOUNTER — Other Ambulatory Visit: Payer: Self-pay | Admitting: Cardiology

## 2022-10-23 LAB — HM DIABETES EYE EXAM

## 2022-10-23 NOTE — Progress Notes (Signed)
Remote ICD transmission.   

## 2022-10-25 ENCOUNTER — Telehealth: Payer: Self-pay | Admitting: Medical

## 2022-10-25 NOTE — Telephone Encounter (Signed)
[facility] Case ID #: [id number] Need Help? Call us at [phone] Outcome Approved on July 2 by [employer] 2017 NCPDP Request Reference Number: [id number]. VASCEPA CAP 1GM is approved through 09/15/2023. Your patient may now fill this prescription and it will be covered. Authorization Expiration Date: 09/15/2023 Drug Vascepa 1GM capsules ePA cloud logo Form [employer] Electronic Prior Authorization Form

## 2022-11-05 ENCOUNTER — Ambulatory Visit: Payer: 59 | Attending: Cardiology | Admitting: Cardiology

## 2022-11-05 ENCOUNTER — Encounter: Payer: Self-pay | Admitting: Cardiology

## 2022-11-05 VITALS — BP 118/70 | HR 65 | Ht 72.0 in | Wt 190.6 lb

## 2022-11-05 DIAGNOSIS — I422 Other hypertrophic cardiomyopathy: Secondary | ICD-10-CM

## 2022-11-05 DIAGNOSIS — Z95810 Presence of automatic (implantable) cardiac defibrillator: Secondary | ICD-10-CM

## 2022-11-05 DIAGNOSIS — I472 Ventricular tachycardia, unspecified: Secondary | ICD-10-CM | POA: Diagnosis not present

## 2022-11-05 NOTE — Progress Notes (Signed)
  Electrophysiology Office Note:   Date:  11/05/2022  ID:  Nathan [name], DOB 03-14-1961, MRN [id number]  Primary Cardiologist: Gypsy Balsam, MD Electrophysiologist: Regan Lemming, MD      History of Present Illness:   Nathan [name] is a 62 y.o. male with h/o Apical hypertrophic cardiomyopathy, diabetes, hypertension, hyperlipidemia seen today for routine electrophysiology followup.  Since last being seen in our clinic the patient reports doing well.  He has no chest pain or shortness of breath.  He is able to do all of his daily activities without restriction.  He is unaware of any arrhythmias or palpitations..  he denies chest pain, palpitations, dyspnea, PND, orthopnea, nausea, vomiting, dizziness, syncope, edema, weight gain, or early satiety.   Review of systems complete and found to be negative unless listed in HPI.      EP Information / Studies Reviewed:    EKG is ordered today. Personal review as below.  EKG Interpretation Date/Time:  Thursday November 05 2022 12:30:24 EDT Ventricular Rate:  65 PR Interval:  196 QRS Duration:  166 QT Interval:  454 QTC Calculation: 472 R Axis:   267  Text Interpretation: Normal sinus rhythm Right bundle branch block Anterolateral infarct (cited on or before 05-Nov-2022) When compared with ECG of 09-Feb-2019 08:26, No significant change was found Confirmed by Ledger Heindl ([id number]) on 11/05/2022 12:45:48 PM   ICD Interrogation-  reviewed in detail today,  See PACEART report.  Device History: Abbott Dual Chamber ICD implanted 11/23/2013 for hypertrophic cardiomyopathy History of appropriate therapy: No History of AAD therapy: No   Risk Assessment/Calculations:              Physical Exam:   VS:  BP 118/70 (BP Location: Left Arm, Patient Position: Sitting, Cuff Size: Large)   Pulse 65   Ht 6' (1.829 m)   Wt 190 lb 9.6 oz (86.5 kg)   SpO2 99%   BMI 25.85 kg/m    Wt Readings from Last 3 Encounters:  11/05/22 190 lb 9.6 oz  (86.5 kg)  08/13/22 190 lb (86.2 kg)  07/14/22 190 lb (86.2 kg)     GEN: Well nourished, well developed in no acute distress NECK: No JVD; No carotid bruits CARDIAC: Regular rate and rhythm, no murmurs, rubs, gallops RESPIRATORY:  Clear to auscultation without rales, wheezing or rhonchi  ABDOMEN: Soft, non-tender, non-distended EXTREMITIES:  No edema; No deformity   ASSESSMENT AND PLAN:    1.  Apical variant hypertrophic cardiomyopathy s/p Abbott dual chamber ICD  euvolemic today Stable on an appropriate medical regimen Normal ICD function See Pace Art report No changes today  2.  Hypertension: well controlled  3.  Ventricular tachycardia: Minimal noted on interrogation.  Continue with current management.  Disposition:   Follow up with EP APP in 12 months   Signed, Barbera Perritt Jorja Loa, MD

## 2022-11-09 ENCOUNTER — Other Ambulatory Visit: Payer: Self-pay | Admitting: Medical

## 2022-11-09 ENCOUNTER — Encounter: Payer: Self-pay | Admitting: Cardiology

## 2022-11-09 NOTE — Telephone Encounter (Signed)
Has appt coming up in september

## 2022-11-09 NOTE — Patient Instructions (Addendum)
SURGICAL WAITING ROOM VISITATION Patients having surgery or a procedure may have no more than 2 support people in the waiting area - these visitors may rotate.    Children under the age of 30 must have an adult with them who is not the patient.  If the patient needs to stay at the hospital during part of their recovery, the visitor guidelines for inpatient rooms apply. Pre-op nurse will coordinate an appropriate time for 1 support person to accompany patient in pre-op.  This support person may not rotate.    Please refer to the [facility] website for the visitor guidelines for Inpatients (after your surgery is over and you are in a regular room).       Your procedure is scheduled on: 11-12-22   Report to [facility] Main Entrance    Report to admitting at 12:30 PM   Call this number if you have problems the morning of surgery [phone]   Do not eat food :After Midnight.   After Midnight you may have the following liquids until 11:45 AM DAY OF SURGERY  Water Non-Citrus Juices (without pulp, NO RED-Apple, White grape, White cranberry) Black Coffee (NO MILK/CREAM OR CREAMERS, sugar ok)  Clear Tea (NO MILK/CREAM OR CREAMERS, sugar ok) regular and decaf                             Plain Jell-O (NO RED)                                           Fruit ices (not with fruit pulp, NO RED)                                     Popsicles (NO RED)                                                               Sports drinks like Gatorade (NO RED)                     If you have questions, please contact your surgeon's office.   FOLLOW  ANY ADDITIONAL PRE OP INSTRUCTIONS YOU RECEIVED FROM YOUR SURGEON'S OFFICE!!!     Oral Hygiene is also important to reduce your risk of infection.                                    Remember - BRUSH YOUR TEETH THE MORNING OF SURGERY WITH YOUR REGULAR TOOTHPASTE   Do NOT smoke after Midnight   Take these medicines the morning of surgery with A SIP  OF WATER:   Disopyramide  Metoprolol  Rosuvastatin  Vascepa  Stop all vitamins and herbal supplements 7 days before surgery  How to Manage Your Diabetes Before and After Surgery  Why is it important to control my blood sugar before and after surgery? Improving blood sugar levels before and after surgery helps healing and can limit problems. A way of improving blood  sugar control is eating a healthy diet by:  Eating less sugar and carbohydrates  Increasing activity/exercise  Talking with your doctor about reaching your blood sugar goals High blood sugars (greater than 180 mg/dL) can raise your risk of infections and slow your recovery, so you will need to focus on controlling your diabetes during the weeks before surgery. Make sure that the doctor who takes care of your diabetes knows about your planned surgery including the date and location.  How do I manage my blood sugar before surgery? Check your blood sugar at least 4 times a day, starting 2 days before surgery, to make sure that the level is not too high or low. Check your blood sugar the morning of your surgery when you wake up and every 2 hours until you get to the Short Stay unit. If your blood sugar is less than 70 mg/dL, you will need to treat for low blood sugar: Do not take insulin. Treat a low blood sugar (less than 70 mg/dL) with  cup of clear juice (cranberry or apple), 4 glucose tablets, OR glucose gel. Recheck blood sugar in 15 minutes after treatment (to make sure it is greater than 70 mg/dL). If your blood sugar is not greater than 70 mg/dL on recheck, call [phone] for further instructions. Report your blood sugar to the short stay nurse when you get to Short Stay.  If you are admitted to the hospital after surgery: Your blood sugar will be checked by the staff and you will probably be given insulin after surgery (instead of oral diabetes medicines) to make sure you have good blood sugar levels. The goal for  blood sugar control after surgery is 80-180 mg/dL.   WHAT DO I DO ABOUT MY DIABETES MEDICATION?  Do not take oral diabetes medicines (pills) the morning of surgery.  Hold Jardiance 3 days before surgery (do not take after 11-08-22)       Hold Ozempic 7 days before surgery (do not take after 11-04-22).  DO NOT TAKE THE FOLLOWING 7 DAYS PRIOR TO SURGERY: Ozempic, Wegovy, Rybelsus (Semaglutide), Byetta (exenatide), Bydureon (exenatide ER), Victoza, Saxenda (liraglutide), or Trulicity (dulaglutide) Mounjaro (Tirzepatide) Adlyxin (Lixisenatide), Polyethylene Glycol Loxenatide.   Reviewed and Endorsed by [facility] Patient Education Committee, August 2015  Bring CPAP mask and tubing day of surgery.                              You may not have any metal on your body including jewelry, and body piercing             Do not wear lotions, powders, cologne, or deodorant              Men may shave face and neck.   Do not bring valuables to the hospital. [facility] IS NOT RESPONSIBLE   FOR VALUABLES.   Contacts, dentures or bridgework may not be worn into surgery.  DO NOT BRING YOUR HOME MEDICATIONS TO THE HOSPITAL. PHARMACY WILL DISPENSE MEDICATIONS LISTED ON YOUR MEDICATION LIST TO YOU DURING YOUR ADMISSION IN THE HOSPITAL!    Patients discharged on the day of surgery will not be allowed to drive home.  Someone NEEDS to stay with you for the first 24 hours after anesthesia.   Special Instructions: Bring a copy of your healthcare power of attorney and living will documents the day of surgery if you haven't scanned them before.  Please read over the following fact sheets you were given: IF YOU HAVE QUESTIONS ABOUT YOUR PRE-OP INSTRUCTIONS PLEASE CALL [phone] [place]  If you received a COVID test during your pre-op visit  it is requested that you wear a mask when out in public, stay away from anyone that may not be feeling well and notify your surgeon if you develop symptoms. If  you test positive for Covid or have been in contact with anyone that has tested positive in the last 10 days please notify you surgeon.  [place] - Preparing for Surgery Before surgery, you can play an important role.  Because skin is not sterile, your skin needs to be as free of germs as possible.  You can reduce the number of germs on your skin by washing with CHG (chlorahexidine gluconate) soap before surgery.  CHG is an antiseptic cleaner which kills germs and bonds with the skin to continue killing germs even after washing. Please DO NOT use if you have an allergy to CHG or antibacterial soaps.  If your skin becomes reddened/irritated stop using the CHG and inform your nurse when you arrive at Short Stay. Do not shave (including legs and underarms) for at least 48 hours prior to the first CHG shower.  You may shave your face/neck.  Please follow these instructions carefully:  1.  Shower with CHG Soap the night before surgery and the  morning of surgery.  2.  If you choose to wash your hair, wash your hair first as usual with your normal  shampoo.  3.  After you shampoo, rinse your hair and body thoroughly to remove the shampoo.                             4.  Use CHG as you would any other liquid soap.  You can apply chg directly to the skin and wash.  Gently with a scrungie or clean washcloth.  5.  Apply the CHG Soap to your body ONLY FROM THE NECK DOWN.   Do   not use on face/ open                           Wound or open sores. Avoid contact with eyes, ears mouth and   genitals (private parts).                       Wash face,  Genitals (private parts) with your normal soap.             6.  Wash thoroughly, paying special attention to the area where your    surgery  will be performed.  7.  Thoroughly rinse your body with warm water from the neck down.  8.  DO NOT shower/wash with your normal soap after using and rinsing off the CHG Soap.                9.  Pat yourself dry with a clean  towel.            10.  Wear clean pajamas.            11.  Place clean sheets on your bed the night of your first shower and do not  sleep with pets. Day of Surgery : Do not apply any lotions/deodorants the morning of surgery.  Please wear clean clothes to the hospital/surgery center.  FAILURE  TO FOLLOW THESE INSTRUCTIONS MAY RESULT IN THE CANCELLATION OF YOUR SURGERY  PATIENT SIGNATURE_________________________________  NURSE SIGNATURE__________________________________  ________________________________________________________________________

## 2022-11-09 NOTE — Progress Notes (Signed)
COVID Vaccine Completed:  Yes  Date of COVID positive in last 90 days:  PCP - Crosby Oyster PA-C Cardiologist - Gypsy Balsam, MD Electrophysiologist - Loman Brooklyn, MD  Chest x-ray -  EKG - 11-05-22 Epic Stress Test -  ECHO - 11-21-21 Epic Cardiac Cath -  Pacemaker/ICD device last checked: 10-06-22 Spinal Cord Stimulator: Cardiac MR - 03-03-12 Epic  Bowel Prep -   Sleep Study -  CPAP -   Fasting Blood Sugar -  Checks Blood Sugar _____ times a day  Ozempic Last dose of GLP1 agonist-  N/A GLP1 instructions:  do not take after 11-04-22   Jardiance Last dose of SGLT-2 inhibitors-  N/A SGLT-2 instructions: do not take after 11-08-22   Blood Thinner Instructions:  Time Aspirin Instructions:  ASA 81 Last Dose:  Activity level:  Can go up a flight of stairs and perform activities of daily living without stopping and without symptoms of chest pain or shortness of breath.  Able to exercise without symptoms  Unable to go up a flight of stairs without symptoms of     Anesthesia review:  Cardiomyopathy, hx of V Tach, AICD, HTN, DM  Patient denies shortness of breath, fever, cough and chest pain at PAT appointment  Patient verbalized understanding of instructions that were given to them at the PAT appointment. Patient was also instructed that they will need to review over the PAT instructions again at home before surgery.

## 2022-11-09 NOTE — Progress Notes (Signed)
PERIOPERATIVE PRESCRIPTION FOR IMPLANTED CARDIAC DEVICE PROGRAMMING  Patient Information: Name:  Nathan [name]  DOB:  1960/11/18  MRN:  [id number]    Planned Procedure:  Laparoscopic bilateral inguinal hernia repairs  Surgeon:  Dr. Star Age  Date of Procedure:  11-12-22  Cautery will be used. Yes  Position during surgery:  Supine   Please send documentation back to:  [facility] (Fax # [phone])  Device Information:  Clinic EP Physician:  Loman Brooklyn, MD   Device Type:  Defibrillator Manufacturer and Phone #:  St. Jude/Abbott: [phone] Pacemaker Dependent?:  No. Date of Last Device Check:  11/05/2022 Normal Device Function?:  No.  Electrophysiologist's Recommendations:  Have magnet available. Provide continuous ECG monitoring when magnet is used or reprogramming is to be performed.  Procedure may interfere with device function.  Magnet should be placed over device during procedure.  Per Device Clinic Standing Orders, [name], RN  1:56 PM 11/09/2022

## 2022-11-10 ENCOUNTER — Encounter (HOSPITAL_COMMUNITY): Payer: Self-pay

## 2022-11-10 ENCOUNTER — Other Ambulatory Visit: Payer: Self-pay

## 2022-11-10 ENCOUNTER — Encounter (HOSPITAL_COMMUNITY)
Admission: RE | Admit: 2022-11-10 | Discharge: 2022-11-10 | Disposition: A | Discharge status: Home / Self Care | Payer: 59 | Source: Ambulatory Visit | Attending: Surgery | Admitting: Surgery

## 2022-11-10 VITALS — BP 116/76 | HR 65 | Temp 98.5°F | Resp 16 | Ht 72.0 in | Wt 191.0 lb

## 2022-11-10 DIAGNOSIS — I422 Other hypertrophic cardiomyopathy: Secondary | ICD-10-CM | POA: Insufficient documentation

## 2022-11-10 DIAGNOSIS — Z01812 Encounter for preprocedural laboratory examination: Secondary | ICD-10-CM | POA: Diagnosis present

## 2022-11-10 DIAGNOSIS — I2510 Atherosclerotic heart disease of native coronary artery without angina pectoris: Secondary | ICD-10-CM | POA: Diagnosis not present

## 2022-11-10 DIAGNOSIS — K4020 Bilateral inguinal hernia, without obstruction or gangrene, not specified as recurrent: Secondary | ICD-10-CM | POA: Insufficient documentation

## 2022-11-10 DIAGNOSIS — Z95810 Presence of automatic (implantable) cardiac defibrillator: Secondary | ICD-10-CM | POA: Insufficient documentation

## 2022-11-10 DIAGNOSIS — E119 Type 2 diabetes mellitus without complications: Secondary | ICD-10-CM | POA: Diagnosis not present

## 2022-11-10 DIAGNOSIS — Z01818 Encounter for other preprocedural examination: Secondary | ICD-10-CM

## 2022-11-10 DIAGNOSIS — I10 Essential (primary) hypertension: Secondary | ICD-10-CM | POA: Diagnosis not present

## 2022-11-10 LAB — BASIC METABOLIC PANEL
Anion gap: 8 (ref 5–15)
BUN: 17 mg/dL (ref 8–23)
CO2: 27 mmol/L (ref 22–32)
Calcium: 9 mg/dL (ref 8.9–10.3)
Chloride: 102 mmol/L (ref 98–111)
Creatinine, Ser: 1.07 mg/dL (ref 0.61–1.24)
GFR, Estimated: >60 mL/min (ref >60)
Glucose, Bld: 90 mg/dL (ref 70–99)
Potassium: 4.2 mmol/L (ref 3.5–5.1)
Sodium: 137 mmol/L (ref 135–145)

## 2022-11-10 LAB — CBC
HCT: 45.9 % (ref 39.0–52.0)
Hemoglobin: 15.7 g/dL (ref 13.0–17.0)
MCH: 31.8 pg (ref 26.0–34.0)
MCHC: 34.2 g/dL (ref 30.0–36.0)
MCV: 92.9 fL (ref 80.0–100.0)
Platelets: 205 10*3/uL (ref 150–400)
RBC: 4.94 MIL/uL (ref 4.22–5.81)
RDW: 12.3 % (ref 11.5–15.5)
WBC: 7.7 10*3/uL (ref 4.0–10.5)
nRBC: 0 % (ref 0.0–0.2)

## 2022-11-10 LAB — HEMOGLOBIN A1C
Hgb A1c MFr Bld: 5.3 % (ref 4.8–5.6)
Mean Plasma Glucose: 105.41 mg/dL

## 2022-11-10 LAB — GLUCOSE, CAPILLARY: Glucose-Capillary: 104 mg/dL — ABNORMAL HIGH (ref 70–99)

## 2022-11-11 ENCOUNTER — Encounter (HOSPITAL_COMMUNITY): Payer: Self-pay | Admitting: Surgery

## 2022-11-11 NOTE — Anesthesia Preprocedure Evaluation (Signed)
Anesthesia Evaluation  Patient identified by MRN, date of birth, ID band Patient awake    Reviewed: Allergy & Precautions, NPO status , Patient's Chart, lab work & pertinent test results, reviewed documented beta blocker date and time   Airway Mallampati: II  TM Distance: >3 FB Neck ROM: Full    Dental  (+) Dental Advisory Given, Teeth Intact   Pulmonary neg pulmonary ROS   Pulmonary exam normal breath sounds clear to auscultation       Cardiovascular hypertension, Pt. on home beta blockers Normal cardiovascular exam+ Cardiac Defibrillator  Rhythm:Regular Rate:Normal  Echo  1. TDS. Hypertrophy of mid segments of LV was noted. Apical segments is deskinetic. Mid left ventricular gradient was not documented. Left ventricular ejection fraction, by estimation, is 60 to 65%. The left ventricle has normal function. The left ventricle demonstrates regional wall motion abnormalities (see scoring diagram/findings for description). There is moderate left ventricular hypertrophy. Left ventricular diastolic parameters are consistent with Grade I diastolic dysfunction (impaired relaxation). There is mild dyskinesis of the left ventricular, apical apical segment.   2. Right ventricular systolic function is normal. The right ventricular size is normal.   3. The mitral valve is normal in structure. No evidence of mitral valve regurgitation. No evidence of mitral stenosis.   4. The aortic valve is normal in structure. Aortic valve regurgitation is not visualized. Aortic valve sclerosis/calcification is present, without any evidence of aortic stenosis.   5. There is mild dilatation of the aortic root, measuring 39 mm.   6. The inferior vena cava is normal in size with greater than 50% respiratory variability, suggesting right atrial pressure of 3 mmHg.      Neuro/Psych negative neurological ROS     GI/Hepatic negative GI ROS, Neg liver ROS,,,   Endo/Other  diabetes    Renal/GU negative Renal ROS     Musculoskeletal negative musculoskeletal ROS (+)    Abdominal   Peds  Hematology negative hematology ROS (+)   Anesthesia Other Findings   Reproductive/Obstetrics                             Anesthesia Physical Anesthesia Plan  ASA: 4  Anesthesia Plan: General   Post-op Pain Management: Tylenol PO (pre-op)* and Gabapentin PO (pre-op)*   Induction: Intravenous  PONV Risk Score and Plan: 4 or greater and Ondansetron, Dexamethasone, Treatment may vary due to age or medical condition and Midazolam  Airway Management Planned: Oral ETT  Additional Equipment: Arterial line  Intra-op Plan:   Post-operative Plan: Extubation in OR  Informed Consent: I have reviewed the patients History and Physical, chart, labs and discussed the procedure including the risks, benefits and alternatives for the proposed anesthesia with the patient or authorized representative who has indicated his/her understanding and acceptance.     Dental advisory given  Plan Discussed with: CRNA  Anesthesia Plan Comments: (2 x PIV  See PAT note 11/10/22)       Anesthesia Quick Evaluation
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
## 2022-11-12 ENCOUNTER — Ambulatory Visit (HOSPITAL_COMMUNITY)
Admission: RE | Admit: 2022-11-12 | Discharge: 2022-11-12 | Disposition: A | Discharge status: Home / Self Care | Payer: 59 | Attending: Surgery | Admitting: Surgery

## 2022-11-12 ENCOUNTER — Other Ambulatory Visit: Payer: Self-pay

## 2022-11-12 ENCOUNTER — Encounter (HOSPITAL_COMMUNITY)
Admission: RE | Disposition: A | Discharge status: Home / Self Care | Payer: Self-pay | Source: Home / Self Care | Attending: Surgery

## 2022-11-12 ENCOUNTER — Ambulatory Visit (HOSPITAL_BASED_OUTPATIENT_CLINIC_OR_DEPARTMENT_OTHER): Payer: 59 | Admitting: Anesthesiology

## 2022-11-12 ENCOUNTER — Ambulatory Visit (HOSPITAL_COMMUNITY): Payer: 59 | Admitting: Physician Assistant

## 2022-11-12 DIAGNOSIS — Z833 Family history of diabetes mellitus: Secondary | ICD-10-CM | POA: Insufficient documentation

## 2022-11-12 DIAGNOSIS — E119 Type 2 diabetes mellitus without complications: Secondary | ICD-10-CM | POA: Insufficient documentation

## 2022-11-12 DIAGNOSIS — I517 Cardiomegaly: Secondary | ICD-10-CM | POA: Diagnosis not present

## 2022-11-12 DIAGNOSIS — K4020 Bilateral inguinal hernia, without obstruction or gangrene, not specified as recurrent: Secondary | ICD-10-CM | POA: Diagnosis not present

## 2022-11-12 DIAGNOSIS — I422 Other hypertrophic cardiomyopathy: Secondary | ICD-10-CM | POA: Insufficient documentation

## 2022-11-12 DIAGNOSIS — I358 Other nonrheumatic aortic valve disorders: Secondary | ICD-10-CM | POA: Insufficient documentation

## 2022-11-12 DIAGNOSIS — I10 Essential (primary) hypertension: Secondary | ICD-10-CM | POA: Insufficient documentation

## 2022-11-12 DIAGNOSIS — K5909 Other constipation: Secondary | ICD-10-CM | POA: Insufficient documentation

## 2022-11-12 DIAGNOSIS — Z7984 Long term (current) use of oral hypoglycemic drugs: Secondary | ICD-10-CM | POA: Diagnosis not present

## 2022-11-12 DIAGNOSIS — Z7982 Long term (current) use of aspirin: Secondary | ICD-10-CM | POA: Diagnosis not present

## 2022-11-12 DIAGNOSIS — Z95810 Presence of automatic (implantable) cardiac defibrillator: Secondary | ICD-10-CM | POA: Insufficient documentation

## 2022-11-12 DIAGNOSIS — K4021 Bilateral inguinal hernia, without obstruction or gangrene, recurrent: Secondary | ICD-10-CM

## 2022-11-12 DIAGNOSIS — I2510 Atherosclerotic heart disease of native coronary artery without angina pectoris: Secondary | ICD-10-CM

## 2022-11-12 DIAGNOSIS — Z01818 Encounter for other preprocedural examination: Secondary | ICD-10-CM

## 2022-11-12 DIAGNOSIS — I472 Ventricular tachycardia, unspecified: Secondary | ICD-10-CM | POA: Diagnosis not present

## 2022-11-12 DIAGNOSIS — M1990 Unspecified osteoarthritis, unspecified site: Secondary | ICD-10-CM | POA: Diagnosis not present

## 2022-11-12 DIAGNOSIS — K429 Umbilical hernia without obstruction or gangrene: Secondary | ICD-10-CM | POA: Diagnosis not present

## 2022-11-12 DIAGNOSIS — D176 Benign lipomatous neoplasm of spermatic cord: Secondary | ICD-10-CM | POA: Insufficient documentation

## 2022-11-12 HISTORY — PX: INGUINAL HERNIA REPAIR: SHX194

## 2022-11-12 LAB — GLUCOSE, CAPILLARY
Glucose-Capillary: 91 mg/dL (ref 70–99)
Glucose-Capillary: 109 mg/dL — ABNORMAL HIGH (ref 70–99)

## 2022-11-12 SURGERY — REPAIR, HERNIA, INGUINAL, LAPAROSCOPIC
Anesthesia: General | Site: Abdomen | Laterality: Bilateral

## 2022-11-12 MED ORDER — PROPOFOL 10 MG/ML IV BOLUS
INTRAVENOUS | Status: AC
  Filled 2022-11-12: qty 20

## 2022-11-12 MED ORDER — LIDOCAINE HCL (PF) 2 % IJ SOLN
INTRAMUSCULAR | Status: AC
  Filled 2022-11-12: qty 5

## 2022-11-12 MED ORDER — OXYCODONE HCL 5 MG PO TABS: 5.0000 mg | ORAL_TABLET | Freq: Once | ORAL | Status: DC | PRN

## 2022-11-12 MED ORDER — BUPIVACAINE-EPINEPHRINE 0.25% -1:200000 IJ SOLN
INTRAMUSCULAR | Status: DC | PRN
  Administered 2022-11-12: 50 mL

## 2022-11-12 MED ORDER — 0.9 % SODIUM CHLORIDE (POUR BTL) OPTIME
TOPICAL | Status: DC | PRN
  Administered 2022-11-12: 1000 mL

## 2022-11-12 MED ORDER — BUPIVACAINE-EPINEPHRINE 0.25% -1:200000 IJ SOLN
INTRAMUSCULAR | Status: AC
  Filled 2022-11-12: qty 1

## 2022-11-12 MED ORDER — LACTATED RINGERS IV SOLN: INTRAVENOUS | Status: DC | PRN

## 2022-11-12 MED ORDER — CELECOXIB 200 MG PO CAPS: 200.0000 mg | ORAL_CAPSULE | ORAL | Status: AC

## 2022-11-12 MED ORDER — PROPOFOL 10 MG/ML IV BOLUS
INTRAVENOUS | Status: DC | PRN
Start: 2022-11-12 — End: 2022-11-12
  Administered 2022-11-12: 120 mg via INTRAVENOUS
  Administered 2022-11-12: 40 mg via INTRAVENOUS

## 2022-11-12 MED ORDER — ACETAMINOPHEN 500 MG PO TABS: 1000.0000 mg | ORAL_TABLET | ORAL | Status: AC

## 2022-11-12 MED ORDER — PHENYLEPHRINE 80 MCG/ML (10ML) SYRINGE FOR IV PUSH (FOR BLOOD PRESSURE SUPPORT)
PREFILLED_SYRINGE | INTRAVENOUS | Status: DC | PRN
  Administered 2022-11-12: 80 ug via INTRAVENOUS

## 2022-11-12 MED ORDER — CEFAZOLIN SODIUM-DEXTROSE 2-4 GM/100ML-% IV SOLN
2.0000 g | INTRAVENOUS | Status: AC
  Administered 2022-11-12: 2 g via INTRAVENOUS

## 2022-11-12 MED ORDER — CHLORHEXIDINE GLUCONATE CLOTH 2 % EX PADS: 6.0000 | MEDICATED_PAD | Freq: Once | CUTANEOUS | Status: DC

## 2022-11-12 MED ORDER — DEXMEDETOMIDINE HCL IN NACL 80 MCG/20ML IV SOLN
INTRAVENOUS | Status: AC
  Filled 2022-11-12: qty 20

## 2022-11-12 MED ORDER — LIDOCAINE HCL 1 % IJ SOLN
INTRAMUSCULAR | Status: AC
  Filled 2022-11-12: qty 20

## 2022-11-12 MED ORDER — FENTANYL CITRATE (PF) 100 MCG/2ML IJ SOLN
INTRAMUSCULAR | Status: AC
  Filled 2022-11-12: qty 2

## 2022-11-12 MED ORDER — ORAL CARE MOUTH RINSE: 15.0000 mL | Freq: Once | OROMUCOSAL | Status: AC

## 2022-11-12 MED ORDER — ONDANSETRON HCL 4 MG/2ML IJ SOLN
INTRAMUSCULAR | Status: AC
  Filled 2022-11-12: qty 2

## 2022-11-12 MED ORDER — DEXAMETHASONE SODIUM PHOSPHATE 10 MG/ML IJ SOLN
INTRAMUSCULAR | Status: DC | PRN
  Administered 2022-11-12: 10 mg via INTRAVENOUS

## 2022-11-12 MED ORDER — CHLORHEXIDINE GLUCONATE 0.12 % MT SOLN
15.0000 mL | Freq: Once | OROMUCOSAL | Status: AC
  Administered 2022-11-12: 15 mL via OROMUCOSAL

## 2022-11-12 MED ORDER — BUPIVACAINE LIPOSOME 1.3 % IJ SUSP
INTRAMUSCULAR | Status: AC
  Filled 2022-11-12: qty 20

## 2022-11-12 MED ORDER — GABAPENTIN 300 MG PO CAPS: 300.0000 mg | ORAL_CAPSULE | ORAL | Status: AC

## 2022-11-12 MED ORDER — HYDROMORPHONE HCL 1 MG/ML IJ SOLN
0.2500 mg | INTRAMUSCULAR | Status: DC | PRN
  Administered 2022-11-12 (×2): 0.5 mg via INTRAVENOUS

## 2022-11-12 MED ORDER — LIDOCAINE 2% (20 MG/ML) 5 ML SYRINGE
INTRAMUSCULAR | Status: DC | PRN
  Administered 2022-11-12: 80 mg via INTRAVENOUS

## 2022-11-12 MED ORDER — LACTATED RINGERS IV SOLN: INTRAVENOUS | Status: DC

## 2022-11-12 MED ORDER — ONDANSETRON HCL 4 MG/2ML IJ SOLN
INTRAMUSCULAR | Status: DC | PRN
Start: 2022-11-12 — End: 2022-11-12
  Administered 2022-11-12: 4 mg via INTRAVENOUS

## 2022-11-12 MED ORDER — BUPIVACAINE LIPOSOME 1.3 % IJ SUSP: 20.0000 mL | Freq: Once | INTRAMUSCULAR | Status: DC

## 2022-11-12 MED ORDER — TRAMADOL HCL 50 MG PO TABS: 50.0000 mg | ORAL_TABLET | Freq: Four times a day (QID) | ORAL | 0 refills | Status: DC | PRN

## 2022-11-12 MED ORDER — MIDAZOLAM HCL 2 MG/2ML IJ SOLN
INTRAMUSCULAR | Status: AC
  Filled 2022-11-12: qty 2

## 2022-11-12 MED ORDER — DEXAMETHASONE SODIUM PHOSPHATE 4 MG/ML IJ SOLN: 4.0000 mg | INTRAMUSCULAR | Status: DC

## 2022-11-12 MED ORDER — SUGAMMADEX SODIUM 200 MG/2ML IV SOLN
INTRAVENOUS | Status: DC | PRN
  Administered 2022-11-12: 200 mg via INTRAVENOUS

## 2022-11-12 MED ORDER — MIDAZOLAM HCL 5 MG/5ML IJ SOLN
INTRAMUSCULAR | Status: DC | PRN
  Administered 2022-11-12: 2 mg via INTRAVENOUS

## 2022-11-12 MED ORDER — BUPIVACAINE LIPOSOME 1.3 % IJ SUSP
INTRAMUSCULAR | Status: DC | PRN
  Administered 2022-11-12: 20 mL

## 2022-11-12 MED ORDER — DROPERIDOL 2.5 MG/ML IJ SOLN: 0.6250 mg | Freq: Once | INTRAMUSCULAR | Status: DC | PRN

## 2022-11-12 MED ORDER — HYDROMORPHONE HCL 1 MG/ML IJ SOLN
INTRAMUSCULAR | Status: AC
  Filled 2022-11-12: qty 1

## 2022-11-12 MED ORDER — GABAPENTIN 300 MG PO CAPS
ORAL_CAPSULE | ORAL | Status: AC
  Administered 2022-11-12: 300 mg via ORAL
  Filled 2022-11-12: qty 1

## 2022-11-12 MED ORDER — FENTANYL CITRATE (PF) 100 MCG/2ML IJ SOLN
INTRAMUSCULAR | Status: DC | PRN
  Administered 2022-11-12 (×4): 50 ug via INTRAVENOUS

## 2022-11-12 MED ORDER — CEFAZOLIN SODIUM-DEXTROSE 2-4 GM/100ML-% IV SOLN
INTRAVENOUS | Status: AC
  Filled 2022-11-12: qty 100

## 2022-11-12 MED ORDER — OXYCODONE HCL 5 MG/5ML PO SOLN: 5.0000 mg | Freq: Once | ORAL | Status: DC | PRN

## 2022-11-12 MED ORDER — ACETAMINOPHEN 500 MG PO TABS
ORAL_TABLET | ORAL | Status: AC
  Administered 2022-11-12: 1000 mg via ORAL
  Filled 2022-11-12: qty 2

## 2022-11-12 MED ORDER — PHENYLEPHRINE HCL-NACL 20-0.9 MG/250ML-% IV SOLN
INTRAVENOUS | Status: DC | PRN
  Administered 2022-11-12: 25 ug/min via INTRAVENOUS

## 2022-11-12 MED ORDER — CELECOXIB 200 MG PO CAPS
ORAL_CAPSULE | ORAL | Status: AC
  Administered 2022-11-12: 200 mg via ORAL
  Filled 2022-11-12: qty 1

## 2022-11-12 MED ORDER — ROCURONIUM BROMIDE 10 MG/ML (PF) SYRINGE
PREFILLED_SYRINGE | INTRAVENOUS | Status: DC | PRN
  Administered 2022-11-12: 80 mg via INTRAVENOUS

## 2022-11-12 SURGICAL SUPPLY — 44 items
APL PRP STRL LF DISP 70% ISPRP (MISCELLANEOUS) ×1
BAG COUNTER SPONGE SURGICOUNT (BAG) IMPLANT
BAG SPNG CNTER NS LX DISP (BAG)
CABLE HIGH FREQUENCY MONO STRZ (ELECTRODE) ×1 IMPLANT
CHLORAPREP W/TINT 26 (MISCELLANEOUS) ×1 IMPLANT
COVER SURGICAL LIGHT HANDLE (MISCELLANEOUS) ×1 IMPLANT
DEVICE SECURE STRAP 25 ABSORB (INSTRUMENTS) IMPLANT
DRAPE WARM FLUID 44X44 (DRAPES) ×1 IMPLANT
DRSG TEGADERM 2-3/8X2-3/4 SM (GAUZE/BANDAGES/DRESSINGS) ×2 IMPLANT
DRSG TEGADERM 4X4.75 (GAUZE/BANDAGES/DRESSINGS) ×1 IMPLANT
ELECT REM PT RETURN 15FT ADLT (MISCELLANEOUS) ×1 IMPLANT
GAUZE SPONGE 2X2 8PLY STRL LF (GAUZE/BANDAGES/DRESSINGS) ×1 IMPLANT
GLOVE ECLIPSE 8.0 STRL XLNG CF (GLOVE) ×1 IMPLANT
GLOVE INDICATOR 8.0 STRL GRN (GLOVE) ×1 IMPLANT
GOWN STRL REUS W/ TWL XL LVL3 (GOWN DISPOSABLE) ×3 IMPLANT
GOWN STRL REUS W/TWL XL LVL3 (GOWN DISPOSABLE) ×3
IRRIG SUCT STRYKERFLOW 2 WTIP (MISCELLANEOUS)
IRRIGATION SUCT STRKRFLW 2 WTP (MISCELLANEOUS) IMPLANT
KIT BASIN OR (CUSTOM PROCEDURE TRAY) ×1 IMPLANT
KIT TURNOVER KIT A (KITS) IMPLANT
MARKER SKIN DUAL TIP RULER LAB (MISCELLANEOUS) ×1 IMPLANT
MESH BARD SOFT 6X6IN (Mesh General) IMPLANT
MESH HERNIA 6X6 BARD (Mesh General) IMPLANT
NDL INSUFFLATION 14GA 120MM (NEEDLE) IMPLANT
NEEDLE INSUFFLATION 14GA 120MM (NEEDLE)
PAD POSITIONING PINK XL (MISCELLANEOUS) ×1 IMPLANT
SCISSORS LAP 5X35 DISP (ENDOMECHANICALS) ×1 IMPLANT
SET TUBE SMOKE EVAC HIGH FLOW (TUBING) ×1 IMPLANT
SLEEVE ADV FIXATION 12X100MM (TROCAR) IMPLANT
SLEEVE ADV FIXATION 5X100MM (TROCAR) ×2 IMPLANT
SPIKE FLUID TRANSFER (MISCELLANEOUS) ×1 IMPLANT
SUT MNCRL AB 4-0 PS2 18 (SUTURE) ×1 IMPLANT
SUT PDS AB 1 CT1 27 (SUTURE) ×2 IMPLANT
SUT VIC AB 2-0 SH 27 (SUTURE)
SUT VIC AB 2-0 SH 27X BRD (SUTURE) IMPLANT
SUT VICRYL 0 UR6 27IN ABS (SUTURE) ×1 IMPLANT
SUT VLOC 3-0 9IN GRN (SUTURE) IMPLANT
SUT VLOC BARB 180 ABS3/0GR12 (SUTURE) ×1
SUTURE VLOC BRB 180 ABS3/0GR12 (SUTURE) IMPLANT
TOWEL OR 17X26 10 PK STRL BLUE (TOWEL DISPOSABLE) ×1 IMPLANT
TOWEL OR NON WOVEN STRL DISP B (DISPOSABLE) ×1 IMPLANT
TRAY LAPAROSCOPIC (CUSTOM PROCEDURE TRAY) ×1 IMPLANT
TROCAR ADV FIXATION 12X100MM (TROCAR) ×1 IMPLANT
TROCAR ADV FIXATION 5X100MM (TROCAR) ×1 IMPLANT

## 2022-11-12 NOTE — Anesthesia Procedure Notes (Signed)
Arterial Line Insertion Start/End8/29/2024 3:46 PM, 11/12/2022 3:56 PM Performed by: [name], MD  Patient location: Pre-op. Preanesthetic checklist: patient identified, IV checked, site marked, risks and benefits discussed, surgical consent, monitors and equipment checked, pre-op evaluation, timeout performed and anesthesia consent Lidocaine 1% used for infiltration Left, radial was placed Catheter size: 20 G Hand hygiene performed  and maximum sterile barriers used   Attempts: 1 Procedure performed without using ultrasound guided technique. Following insertion, dressing applied and Biopatch. Post procedure assessment: normal and unchanged  Patient tolerated the procedure well with no immediate complications.

## 2022-11-12 NOTE — Transfer of Care (Signed)
Immediate Anesthesia Transfer of Care Note  Patient: Nathan [name]  Procedure(s) Performed: Procedure(s): LAPAROSCOPIC BILATERAL INGUINAL HERNIAS REPAIR WITH MESH, PRIMARY UMBILICAL HERNIA REPAIR (Bilateral)  Patient Location: PACU  Anesthesia Type:General  Level of Consciousness:  sedated, patient cooperative and responds to stimulation  Airway & Oxygen Therapy:Patient Spontanous Breathing and Patient connected to face mask oxgen  Post-op Assessment:  Report given to PACU RN and Post -op Vital signs reviewed and stable  Post vital signs:  Reviewed and stable  Last Vitals:  Vitals:   11/12/22 1256 11/12/22 1736  BP: 120/78 117/73  Pulse: 63 74  Resp: 16 11  Temp: 36.9 C (!) 36.3 C  SpO2: 97% 100%    Complications: No apparent anesthesia complications

## 2022-11-12 NOTE — Anesthesia Procedure Notes (Signed)
Procedure Name: Intubation Date/Time: 11/12/2022 4:17 PM  Performed by: Theodosia Quay, CRNAPre-anesthesia Checklist: Patient identified, Emergency Drugs available, Suction available, Patient being monitored and Timeout performed Patient Re-evaluated:Patient Re-evaluated prior to induction Oxygen Delivery Method: Circle system utilized Preoxygenation: Pre-oxygenation with 100% oxygen Induction Type: IV induction Ventilation: Mask ventilation without difficulty Laryngoscope Size: Mac and 4 Grade View: Grade II Tube type: Oral Tube size: 7.5 mm Number of attempts: 1 Airway Equipment and Method: Stylet Placement Confirmation: ETT inserted through vocal cords under direct vision, positive ETCO2, CO2 detector and breath sounds checked- equal and bilateral Secured at: 23 cm Tube secured with: Tape Dental Injury: Teeth and Oropharynx as per pre-operative assessment  Comments: ATOI

## 2022-11-12 NOTE — H&P (Signed)
11/12/2022     REFERRING PHYSICIAN: Tysinger, Mertha Finders, *  Patient Care Team: Tysinger, Mertha Finders, PA as PCP - General (Family Medicine) Michaell Cowing, Shawn Route, MD as Consulting Provider (General Surgery) Georgeanna Lea, MD (Cardiovascular Disease)  PROVIDER: Jarrett Soho, MD  [facility] MRN: [id number] DOB: 09-03-1960  SUBJECTIVE   Chief Complaint: New Consultation and Inguinal Hernia   Nathan [name] is a 62 y.o. male  who is seen today as an office consultation  at the request of Dr.. Tysinger  for evaluation of hernia.  History of Present Illness:  Patient has numerous health issues. Struggle with constipation and straining. Noted some left groin discomfort. Inguinal hernia suspected. Was not too bothersome in the spring but has become more concerning. Patient worried he has a hernia on the other right side as well. Surgical consultation offered. He has history of some hypertrophic cardiomyopathy and ventricular tachycardia with a cardiac defibrillator in place. Followed by Dr. Donnie Aho But now converted to Dr. Bing Matter since Dr. Donnie Aho retired. Patient can walk least 1/2-hour without difficulty. He is to have mild constipation which got little worse on Ozempic but he has been taking MiraLAX and Colace and occasionally mag citrate to compensate for that. He does not smoke. He has never had any abdominal surgery. Does not have any significant nocturia or difficulty starting or maintaining stream. No prostatism. No history of UTIs. He is on a low-dose aspirin but no major anticoagulation. No prostate cancer issues or radiation.  Medical History:  Past Medical History:  Diagnosis Date  Arrhythmia   Patient Active Problem List  Diagnosis  Apical variant hypertrophic cardiomyopathy (CMS/HHS-HCC)  Non-recurrent bilateral inguinal hernia without obstruction or gangrene  Cardiac defibrillator in place  Diabetes mellitus type 2, noninsulin dependent (CMS/HHS-HCC)   Past  Surgical History:  Procedure Laterality Date  pacemaker surgery    No Known Allergies  Current Outpatient Medications on File Prior to Visit  Medication Sig Dispense Refill  aspirin 81 MG EC tablet Take 81 mg by mouth once daily  disopyramide phosphate (NORPACE) 100 MG capsule Take 1 capsule by mouth every 8 (eight) hours  empagliflozin (JARDIANCE) 10 mg tablet Take 1 tablet by mouth every morning before breakfast (0630)  metoprolol succinate (TOPROL-XL) 100 MG XL tablet TAKE 1 TABLET BY MOUTH IN THE MORNING WITH OR IMMEDIATELY FOLLOWING A MEAL AND ONE-HALF TABLET BY MOUTH IN THE EVENING  rosuvastatin (CRESTOR) 20 MG tablet Take 20 mg by mouth once daily  VASCEPA 1 gram capsule Take 2 capsules by mouth 2 (two) times daily   No current facility-administered medications on file prior to visit.   Family History  Problem Relation Age of Onset  Coronary Artery Disease (Blocked arteries around heart) Father  Diabetes Father    Social History   Tobacco Use  Smoking Status Never  Smokeless Tobacco Never    Social History   Socioeconomic History  Marital status: Married  Tobacco Use  Smoking status: Never  Smokeless tobacco: Never   ############################################################  Review of Systems: A complete review of systems (ROS) was obtained from the patient.  We have reviewed this information and discussed as appropriate with the patient.  See HPI as well for other pertinent ROS.  Constitutional: No fevers, chills, sweats. Weight stable Eyes: No vision changes, No discharge HENT: No sore throats, nasal drainage Lymph: No neck swelling, No bruising easily Pulmonary: No cough, productive sputum CV: No orthopnea, PND . No exertional chest/neck/shoulder/arm pain. Patient can walk 30 minutes without difficulty.  GI: No personal nor family history of GI/colon cancer, inflammatory bowel disease, irritable bowel syndrome, allergy such as Celiac Sprue,  dietary/dairy problems, colitis, ulcers nor gastritis. No recent sick contacts/gastroenteritis. No travel outside the country. No changes in diet.  Renal: No UTIs, No hematuria Genital: No drainage, bleeding, masses Musculoskeletal: No severe joint pain. Good ROM major joints Skin: No sores or lesions Heme/Lymph: No easy bleeding. No swollen lymph nodes Neuro: No active seizures. No facial droop Psych: No hallucinations. No agitation  OBJECTIVE   Vitals:  08/31/22 1542  BP: 107/70  Pulse: 62  Temp: 36.7 C (98 F)  SpO2: 99%  Weight: 86.6 kg (191 lb)  Height: 182.9 cm (6')   Body mass index is 25.9 kg/m.  PHYSICAL EXAM:  Constitutional: Not cachectic. Hygeine adequate. Vitals signs as above.  Eyes: No glasses. Vision adequate,Pupils reactive, normal extraocular movements. Sclera nonicteric Neuro: CN II-XII intact. No major focal sensory defects. No major motor deficits. Lymph: No head/neck/groin lymphadenopathy Psych: No severe agitation. No severe anxiety. Judgment & insight Adequate, Oriented x4, HENT: Normocephalic, Mucus membranes moist. No thrush. Hearing: adequate Neck: Supple, No tracheal deviation. No obvious thyromegaly Chest: No pain to chest wall compression. Good respiratory excursion. No audible wheezing CV: Pulses intact. regular. No major extremity edema Ext: No obvious deformity or contracture. Edema: Not present. No cyanosis Skin: No major subcutaneous nodules. Warm and dry Musculoskeletal: Severe joint rigidity not present. No obvious clubbing. No digital petechiae. Mobility: no assist device moving easily without restrictions  Abdomen: Flat Soft. Nondistended. Nontender. Hernia: Not present. Diastasis recti: Mild supraumbilical midline. No hepatomegaly. No splenomegaly.  Genital/Pelvic: Inguinal hernia: Left greater than right bilateral inguinal hernias. They seem lateral/indirect. No testicular masses. Normal external male genitalia.. Inguinal lymph nodes:  without lymphadenopathy nor hidradenitis.   Rectal: (Deferred)    ###################################################################  Labs, Imaging and Diagnostic Testing:  Located in 'Care Everywhere' section of Epic EMR chart  PRIOR [facility] CLINIC NOTES:  Not applicable  SURGERY NOTES:  Not applicable  PATHOLOGY:  Not applicable  Assessment and Plan:  DIAGNOSES:  Diagnoses and all orders for this visit:  Non-recurrent bilateral inguinal hernia without obstruction or gangrene  Cardiac defibrillator in place  Diabetes mellitus type 2, noninsulin dependent (CMS/HHS-HCC)  Apical variant hypertrophic cardiomyopathy (CMS/HHS-HCC)    ASSESSMENT/PLAN  Left greater than right bilateral inguinal hernias. Standard care assisting consider repair. Reasonable candidate for laparoscopic repair  The anatomy & physiology of the abdominal wall and pelvic floor was discussed. The pathophysiology of hernias in the inguinal and pelvic region was discussed. Natural history risks such as progressive enlargement, pain, incarceration, and strangulation was discussed. Contributors to complications such as smoking, obesity, diabetes, prior surgery, etc were discussed.   I feel the risks of no intervention will lead to serious problems that outweigh the operative risks; therefore, I recommended surgery to reduce and repair the hernia. I explained laparoscopic techniques with possible need for an open approach. I noted usual use of mesh to patch and/or buttress hernia repair  Risks such as bleeding, infection, abscess, need for further treatment, injury to other organs, need for repair of tissues / organs, stroke, heart attack, death, and other risks were discussed. I noted a good likelihood this will help address the problem. Goals of post-operative recovery were discussed as well. Possibility that this will not correct all symptoms was explained. I stressed the importance of low-impact activity,  aggressive pain control, avoiding constipation, & not pushing through pain to minimize risk of post-operative  chronic pain or injury. Possibility of reherniation was discussed. We will work to minimize complications.   An educational handout further explaining the pathology & treatment options was given as well. Questions were answered. The patient expresses understanding & wishes to proceed with surgery.  He does have apical variant hypertrophic cardiomyopathy and has a defibrillator in place. Followed by Dr. Earle Gell ski. Seems to have good exercise tolerance. Will double check that there is no concerns. Last seen by cardiology in April. Hopefully low risk.  Does have some chronic constipation. He thinks it is related to Ozempic that was started. That is helped his diabetes a lot. Recommend he increase MiraLAX until he is having 1 or 2 bowel once a day and simplify the regimen.    Ardeth Sportsman, MD, FACS, MASCRS Esophageal, Gastrointestinal & Colorectal Surgery Robotic and Minimally Invasive Surgery  [facility] [address], [address] [place], [place] [address] [phone] Fax [phone] Main  CONTACT INFORMATION: Weekday (9AM-5PM): Call [facility] main office at [phone] Weeknight (5PM-9AM) or Weekend/Holiday: Check EPIC "Web Links" tab & use "AMION" (password " [email or web address]") for General Surgery [facility] coverage  Please, DO NOT use SecureChat  (it is not reliable communication to reach operating surgeons & will lead to a delay in care).   Epic staff messaging available for outptient concerns needing 1-2 business day response.

## 2022-11-12 NOTE — Op Note (Signed)
11/12/2022  5:22 PM  PATIENT:  Nathan [name]  62 y.o. male  Patient Care Team: Tysinger, Kermit Balo, PA-C as PCP - General (Family Medicine) Georgeanna Lea, MD as PCP - Cardiology (Cardiology) Regan Lemming, MD as PCP - Electrophysiology (Cardiology) Karie Soda, MD as Consulting Physician (General Surgery)  PRE-OPERATIVE DIAGNOSIS:  BILATERAL INGUINAL HERNIAS  POST-OPERATIVE DIAGNOSIS:  BILATERAL INGUINAL HERNIAS UMBILICAL HERNIA  PROCEDURE:   LAPAROSCOPIC BILATERAL INGUINAL HERNIAS REPAIR WITH MESH PRIMARY UMBILICAL HERNIA REPAIR TRANSVERSUS ABDOMINIS PLANE (TAP) BLOCK - BILATERAL  SURGEON:  Ardeth Sportsman, MD  ASSISTANT:  (n/a)   ANESTHESIA:  General endotracheal intubation anesthesia (GETA) and Regional TRANSVERSUS ABDOMINIS PLANE (TAP) nerve block -BILATERAL for perioperative & postoperative pain control at the level of the transverse abdominis & preperitoneal spaces along the flank at the anterior axillary line, from subcostal ridge to iliac crest under laparoscopic guidance provided with liposomal bupivacaine (Experel) 20mL mixed with 50 mL of bupivicaine 0.25% with epinephrine  Estimated Blood Loss (EBL):   Total I/O In: 500 [I.V.:500] Out: 25 [Blood:25].   (See anesthesia record)  Delay start of Pharmacological VTE agent (>24hrs) due to concerns of significant anemia, surgical blood loss, or risk of bleeding?:  no  DRAINS: (None)  SPECIMEN:  CORD LIPOMAS (not sent)  DISPOSITION OF SPECIMEN:  (not applicable)  COUNTS:  Sponge, needle, & instrument counts CORRECT  PLAN OF CARE: Discharge to home after PACU  PATIENT DISPOSITION:  PACU - hemodynamically stable.  INDICATION: Pleasant patient with some cardiomyopathy but otherwise excellent exercise tolerance found to have left greater than right symptomatic inguinal hernias.  Cleared by cardiology.  I recommended exploration and repair of hernias found.  The anatomy & physiology of the abdominal wall  and pelvic floor was discussed.  The pathophysiology of hernias in the inguinal and pelvic region was discussed.  Natural history risks such as progressive enlargement, pain, incarceration & strangulation was discussed.   Contributors to complications such as smoking, obesity, diabetes, prior surgery, etc were discussed.    I feel the risks of no intervention will lead to serious problems that outweigh the operative risks; therefore, I recommended surgery to reduce and repair the hernia.  I explained laparoscopic techniques with possible need for an open approach.  I noted usual use of mesh to patch and/or buttress hernia repair  Risks such as bleeding, infection, abscess, need for further treatment, heart attack, death, and other risks were discussed.  I noted a good likelihood this will help address the problem.   Goals of post-operative recovery were discussed as well.  Possibility that this will not correct all symptoms was explained.  I stressed the importance of low-impact activity, aggressive pain control, avoiding constipation, & not pushing through pain to minimize risk of post-operative chronic pain or injury. Possibility of reherniation was discussed.  We will work to minimize complications.     An educational handout further explaining the pathology & treatment options was given as well.  Questions were answered.  The patient expresses understanding & wishes to proceed with surgery.  OR FINDINGS: Moderate size indirect inguinal hernia on the left side.  No definite direct space nor femoral hernias.  Mild laxity at left obturator foramen but no major hernia there.  On the right side smaller but definite indirect inguinal hernia.  No direct space, femoral, obturator hernias.  Small umbilical hernia through the stalk 7 mm.  Primarily repaired.  DESCRIPTION:  The patient was identified & brought into the operating room.  The patient was positioned supine with arms tucked. SCDs were active  during the entire case. The patient underwent general anesthesia without any difficulty.  The abdomen was prepped and draped in a sterile fashion. The patient's bladder was emptied.  A Surgical Timeout confirmed our plan.  I made a transverse incision through the inferior umbilical fold.  I made a small transverse nick through the anterior rectus fascia contralateral to the inguinal hernia side and placed a 0-vicryl stitch through the fascia.  I placed a Hasson trocar into the preperitoneal plane.  Entry was clean.  We induced carbon dioxide insufflation. Camera inspection revealed no injury.  I used a 10mm angled scope to bluntly free the peritoneum off the infraumbilical anterior abdominal wall.  I created enough of a preperitoneal pocket to place 5mm ports into the right & left mid-abdomen into this preperitoneal cavity.  I focused attention on the LEFT pelvis since that was the dominant hernia side.   I used blunt & focused sharp dissection to free the peritoneum off the flank and down to the pubic rim.  I freed the anteriolateral bladder wall off the anteriolateral pelvic wall, sparing midline attachments.   I located a swath of peritoneum going into a hernia fascial defect at the  internal ring consistent with  an indirect inguinal hernia..  I gradually freed the peritoneal hernia sac off safely and reduced it into the preperitoneal space.  I freed the peritoneum off the spermatic vessels & vas deferens.  I freed peritoneum off the retroperitoneum along the psoas muscle.  Inguinal canal cord lipoma was dissected away & removed.  I checked & assured hemostasis.  Ultimately did lateral peritoneal repair and use that to tack the hernia sac more cephalad as well using 3-0 V-Loc absorbable serrated suture  I turned attention on the opposite  RIGHT pelvis.  I did dissection in a similar, mirror-image fashion. The patient had an indirect inguinal hernia..    Ingunial canal cord lipoma was dissected away &  removed.    I checked & assured hemostasis.  Patient had some inflammation and adhesions of the hernia sac.  I repaired the peritoneum and did a good high ligation of the hernia sac with 3-0 V lock as well.  That allowed me to help tack the moderately large hernia sac  I chose sheets of medium-weight polypropylene Bard Marlex 15x15cm, one for each side.  I cut a single sigmoid-shaped slit ~6cm from a corner of each mesh.  I placed the meshes into the preperitoneal space & laid them as overlapping diamonds such that at the inferior points, a 6x6 cm corner flap rested in the true anterolateral pelvis, covering the obturator & femoral foramina.   I allowed the bladder to return to the pubis, this helping tuck the corners of the mesh in the anteriolateral pelvis.  The medial corners overlapped each other across midline cephalad to the pubic rim.   Given the numerous hernias of moderate size, I placed a third 15x15cm Bard soft light weight polypropylene mesh in the center as a vertical diamond.  The lateral wings of the mesh overlap across the direct spaces and internal rings where the dominant hernias were.  This provided good coverage and reinforcement of the hernia repairs.  Because of the central mesh placement with good overlap, I did not place any tacks.   I held the hernia sacs cephalad & evacuated carbon dioxide.  I closed the fascia with absorbable suture.  I closed the skin  using 4-0 monocryl stitch.  Sterile dressings were applied.   Patient did well intraoperatively without any hemodynamic or dysrhythmic concerns.  The patient was extubated & arrived in the PACU in stable condition..  I had discussed postoperative care with the patient in the holding area.  Instructions are written in the chart.  I discussed operative findings, updated the patient's status, discussed probable steps to recovery, and gave postoperative recommendations to the patient's spouse, Ginger Monia Pouch.  Recommendations were made.   Questions were answered.  She expressed understanding & appreciation.   Ardeth Sportsman, M.D., F.A.C.S. Gastrointestinal and Minimally Invasive Surgery [facility] [address], [address] [place], [place] [address] [phone] Main / Paging  11/12/2022 5:22 PM

## 2022-11-12 NOTE — Anesthesia Postprocedure Evaluation (Signed)
Anesthesia Post Note  Patient: Nathan [name]  Procedure(s) Performed: LAPAROSCOPIC BILATERAL INGUINAL HERNIAS REPAIR WITH MESH, PRIMARY UMBILICAL HERNIA REPAIR (Bilateral: Abdomen)     Patient location during evaluation: PACU Anesthesia Type: General Level of consciousness: sedated and patient cooperative Pain management: pain level controlled Vital Signs Assessment: post-procedure vital signs reviewed and stable Respiratory status: spontaneous breathing Cardiovascular status: stable Anesthetic complications: no   No notable events documented.  Last Vitals:  Vitals:   11/12/22 1815 11/12/22 1841  BP: 115/78 113/74  Pulse: 71 71  Resp: 13   Temp:  36.7 C  SpO2: 95% 98%    Last Pain:  Vitals:   11/12/22 1841  TempSrc: Oral  PainSc:                  [name]
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
## 2022-11-12 NOTE — Discharge Instructions (Signed)
HERNIA REPAIR: POST OP INSTRUCTIONS  ######################################################################  EAT Gradually transition to a high fiber diet with a fiber supplement over the next few weeks after discharge.  Start with a pureed / full liquid diet (see below)  WALK Walk an hour a day.  Control your pain to do that.    CONTROL PAIN Control pain so that you can walk, sleep, tolerate sneezing/coughing, and go up/down stairs.  HAVE A BOWEL MOVEMENT DAILY Keep your bowels regular to avoid problems.  OK to try a laxative to override constipation.  OK to use an antidairrheal to slow down diarrhea.  Call if not better after 2 tries  CALL IF YOU HAVE PROBLEMS/CONCERNS Call if you are still struggling despite following these instructions. Call if you have concerns not answered by these instructions  ######################################################################    DIET: Follow a light bland diet & liquids the first 24 hours after arrival home, such as soup, liquids, starches, etc.  Be sure to drink plenty of fluids.  Quickly advance to a usual solid diet within a few days.  Avoid fast food or heavy meals as your are more likely to get nauseated or have irregular bowels.  A low-fat, high-fiber diet for the rest of your life is ideal.   Take your usually prescribed home medications unless otherwise directed.  PAIN CONTROL: Pain is best controlled by a usual combination of three different methods TOGETHER: Ice/Heat Over the counter pain medication Prescription pain medication Most patients will experience some swelling and bruising around the hernia(s) such as the bellybutton, groins, or old incisions.  Ice packs or heating pads (30-60 minutes up to 6 times a day) will help. Use ice for the first few days to help decrease swelling and bruising, then switch to heat to help relax tight/sore spots and speed recovery.  Some people prefer to use ice alone, heat alone, alternating  between ice & heat.  Experiment to what works for you.  Swelling and bruising can take several weeks to resolve.   It is helpful to take an over-the-counter pain medication regularly for the first few weeks.  Choose one of the following that works best for you: Naproxen (Aleve, etc)  Two 220mg tabs twice a day Ibuprofen (Advil, etc) Three 200mg tabs four times a day (every meal & bedtime) Acetaminophen (Tylenol, etc) 325-650mg four times a day (every meal & bedtime) A  prescription for pain medication should be given to you upon discharge.  Take your pain medication as prescribed.  If you are having problems/concerns with the prescription medicine (does not control pain, nausea, vomiting, rash, itching, etc), please call us [phone] to see if we need to switch you to a different pain medicine that will work better for you and/or control your side effect better. If you need a refill on your pain medication, please contact your pharmacy.  They will contact our office to request authorization. Prescriptions will not be filled after 5 pm or on week-ends.  AVOID GETTING CONSTIPATED.   Between the surgery and the pain medications, it is common to experience some constipation.  Drink plenty of liquids Take a fiber supplement 2 times day (such as Metamucil, Citrucel, FiberCon, MiraLax, etc) to have a bowel movement every day. If you have not had a BM by 2 days after surgery: -drink liquids only until you have a bowel movement -take MiraLAX 2 doses every 2 hours until you have a bowel movement   Wash / shower every day.  You may   shower over the dressings as they are waterproof.    It is good for closed incisions and even open wounds to be washed every day.  Shower every day.  Short baths are fine.  Wash the incisions and wounds clean with soap & water.    You may leave closed incisions open to air if it is dry.   You may cover the incision with clean gauze & replace it after your daily shower for  comfort.  TEGADERM:  You have clear gauze band-aid dressings over your closed incision(s).  Remove the dressings 3 days after surgery.    ACTIVITIES as tolerated:   You may resume regular (light) daily activities beginning the next day--such as daily self-care, walking, climbing stairs--gradually increasing activities as tolerated.  Control your pain so that you can walk an hour a day.  If you can walk 30 minutes without difficulty, it is safe to try more intense activity such as jogging, treadmill, bicycling, low-impact aerobics, swimming, etc. Save the most intensive and strenuous activity for last such as sit-ups, heavy lifting, contact sports, etc  Refrain from any heavy lifting or straining until you are off narcotics for pain control.   DO NOT PUSH THROUGH PAIN.  Let pain be your guide: If it hurts to do something, don't do it.  Pain is your body warning you to avoid that activity for another week until the pain goes down. You may drive when you are no longer taking prescription pain medication, you can comfortably wear a seatbelt, and you can safely maneuver your car and apply brakes. You may have sexual intercourse when it is comfortable.   FOLLOW UP in our office Please call [facility] at [phone] to set up an appointment to see your surgeon in the office for a follow-up appointment approximately 2-3 weeks after your surgery. Make sure that you call for this appointment the day you arrive home to insure a convenient appointment time.  9.  If you have disability of FMLA / Family leave forms, please bring the forms to the office for processing.  (do not give to your surgeon).  WHEN TO CALL US [phone]: Poor pain control Reactions / problems with new medications (rash/itching, nausea, etc)  Fever over 101.5 F (38.5 C) Inability to urinate Nausea and/or vomiting Worsening swelling or bruising Continued bleeding from incision. Increased pain, redness, or drainage from the  incision   The clinic staff is available to answer your questions during regular business hours (8:30am-5pm).  Please don't hesitate to call and ask to speak to one of our nurses for clinical concerns.   If you have a medical emergency, go to the nearest emergency room or call 911.  A surgeon from [facility] is always on call at the hospitals in [place]  [facility] [address], [address], [place], [place]  [address] ?  [address] [address], [place], [place]   [address] MAIN: [phone] ? TOLL FREE: [phone] ? FAX: [phone] [email or web address]  

## 2022-11-13 ENCOUNTER — Encounter (HOSPITAL_COMMUNITY): Payer: Self-pay | Admitting: Surgery

## 2022-11-17 ENCOUNTER — Ambulatory Visit: Payer: 59 | Admitting: Medical

## 2022-11-17 VITALS — BP 110/70 | HR 78

## 2022-11-17 DIAGNOSIS — Z23 Encounter for immunization: Secondary | ICD-10-CM

## 2022-11-17 DIAGNOSIS — I10 Essential (primary) hypertension: Secondary | ICD-10-CM

## 2022-11-17 DIAGNOSIS — Z0000 Encounter for general adult medical examination without abnormal findings: Secondary | ICD-10-CM | POA: Diagnosis not present

## 2022-11-17 DIAGNOSIS — Z1211 Encounter for screening for malignant neoplasm of colon: Secondary | ICD-10-CM

## 2022-11-17 DIAGNOSIS — E1165 Type 2 diabetes mellitus with hyperglycemia: Secondary | ICD-10-CM | POA: Diagnosis not present

## 2022-11-17 DIAGNOSIS — Z7185 Encounter for immunization safety counseling: Secondary | ICD-10-CM | POA: Diagnosis not present

## 2022-11-17 DIAGNOSIS — Z125 Encounter for screening for malignant neoplasm of prostate: Secondary | ICD-10-CM

## 2022-11-17 DIAGNOSIS — R0989 Other specified symptoms and signs involving the circulatory and respiratory systems: Secondary | ICD-10-CM
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
## 2022-11-18 LAB — MICROALBUMIN / CREATININE URINE RATIO
Creatinine, Urine: 43.8 mg/dL
Microalb/Creat Ratio: <7 mg/g{creat} (ref 0–29)
Microalbumin, Urine: <3 ug/mL

## 2022-11-18 LAB — HEPATIC FUNCTION PANEL
ALT: 15 IU/L (ref 0–44)
AST: 16 IU/L (ref 0–40)
Albumin: 4.2 g/dL (ref 3.9–4.9)
Alkaline Phosphatase: 56 IU/L (ref 44–121)
Bilirubin Total: 1.4 mg/dL — ABNORMAL HIGH (ref 0.0–1.2)
Bilirubin, Direct: 0.38 mg/dL (ref 0.00–0.40)
Total Protein: 7 g/dL (ref 6.0–8.5)

## 2022-11-18 LAB — LIPID PANEL
Chol/HDL Ratio: 4.1 ratio (ref 0.0–5.0)
Cholesterol, Total: 145 mg/dL (ref 100–199)
HDL: 35 mg/dL — ABNORMAL LOW (ref >39)
LDL Chol Calc (NIH): 91 mg/dL (ref 0–99)
Triglycerides: 102 mg/dL (ref 0–149)
VLDL Cholesterol Cal: 19 mg/dL (ref 5–40)

## 2022-11-18 LAB — PSA: Prostate Specific Ag, Serum: 0.5 ng/mL (ref 0.0–4.0)

## 2022-11-18 NOTE — Progress Notes (Signed)
Cholesterol numbers okay, liver test okay microalbumin kidney marker still pending.  Prostate marker okay.  Expect phone calls about referral to gastro, referral for ABI blood flow screening  If you go ahead and stop Ozempic and lets plan to recheck your numbers in 4 months

## 2022-11-18 NOTE — Progress Notes (Signed)
Microalbumin test normal

## 2022-11-26 ENCOUNTER — Encounter (HOSPITAL_COMMUNITY): Payer: 59

## 2022-11-29 ENCOUNTER — Other Ambulatory Visit: Payer: Self-pay | Admitting: Medical

## 2022-11-30 ENCOUNTER — Ambulatory Visit (HOSPITAL_COMMUNITY)
Admission: RE | Admit: 2022-11-30 | Discharge: 2022-11-30 | Disposition: A | Discharge status: Home / Self Care | Payer: 59 | Source: Ambulatory Visit | Attending: Medical | Admitting: Medical

## 2022-11-30 DIAGNOSIS — E1165 Type 2 diabetes mellitus with hyperglycemia: Secondary | ICD-10-CM | POA: Diagnosis present

## 2022-11-30 DIAGNOSIS — R0989 Other specified symptoms and signs involving the circulatory and respiratory systems: Secondary | ICD-10-CM | POA: Diagnosis present

## 2022-11-30 NOTE — Progress Notes (Signed)
Your ABI blood flow study was normal except for the right great toe was slightly reduced  If you are having any type of symptoms such as discomfort or pain in the legs with activity or walking then there would be other evaluation  If no other symptoms of concern currently, then I would recommend rechecking an ABI screen like this again in a year

## 2022-12-05 ENCOUNTER — Other Ambulatory Visit: Payer: Self-pay | Admitting: Medical

## 2022-12-10 NOTE — Telephone Encounter (Signed)
PA Eline Case ID #: [id number] Need Help? Call us at [phone] Outcome Approved on July 2 by [employer] 2017 NCPDP Request Reference Number: [id number]. VASCEPA CAP 1GM is approved through 09/15/2023. Your patient may now fill this prescription and it will be covered. Authorization Expiration Date: 09/15/2023 Drug Vascepa 1GM capsules ePA cloud logo Form [employer] Electronic Prior Authorization Form

## 2022-12-22 ENCOUNTER — Other Ambulatory Visit: Payer: Self-pay | Admitting: Cardiology

## 2023-01-05 ENCOUNTER — Ambulatory Visit (INDEPENDENT_AMBULATORY_CARE_PROVIDER_SITE_OTHER): Payer: 59

## 2023-01-05 DIAGNOSIS — I422 Other hypertrophic cardiomyopathy: Secondary | ICD-10-CM | POA: Diagnosis not present

## 2023-01-05 DIAGNOSIS — I472 Ventricular tachycardia, unspecified: Secondary | ICD-10-CM

## 2023-01-07 LAB — CUP PACEART REMOTE DEVICE CHECK
Battery Remaining Longevity: 24 mo
Battery Remaining Percentage: 24 %
Battery Voltage: 2.8 V
Brady Statistic AP VP Percent: 1 %
Brady Statistic AP VS Percent: 11 %
Brady Statistic AS VP Percent: 1 %
Brady Statistic AS VS Percent: 88 %
Brady Statistic RA Percent Paced: 11 %
Brady Statistic RV Percent Paced: 1 %
Date Time Interrogation Session: 20241022020021
HighPow Impedance: 40 Ohm
HighPow Impedance: 40 Ohm
Implantable Lead Connection Status: 753985
Implantable Lead Connection Status: 753985
Implantable Lead Implant Date: 20150910
Implantable Lead Implant Date: 20150910
Implantable Lead Location: 753859
Implantable Lead Location: 753860
Implantable Pulse Generator Implant Date: 20150910
Lead Channel Impedance Value: 360 Ohm
Lead Channel Impedance Value: 400 Ohm
Lead Channel Pacing Threshold Amplitude: 1 V
Lead Channel Pacing Threshold Amplitude: 1.5 V
Lead Channel Pacing Threshold Pulse Width: 0.5 ms
Lead Channel Pacing Threshold Pulse Width: 0.5 ms
Lead Channel Sensing Intrinsic Amplitude: 2.4 mV
Lead Channel Sensing Intrinsic Amplitude: 6.7 mV
Lead Channel Setting Pacing Amplitude: 2 V
Lead Channel Setting Pacing Amplitude: 2.5 V
Lead Channel Setting Pacing Pulse Width: 0.5 ms
Lead Channel Setting Sensing Sensitivity: 0.5 mV
Pulse Gen Serial Number: 7208008

## 2023-01-22 NOTE — Progress Notes (Signed)
Remote ICD transmission.   

## 2023-02-10 ENCOUNTER — Encounter: Payer: Self-pay | Admitting: Medical

## 2023-02-10 ENCOUNTER — Ambulatory Visit (INDEPENDENT_AMBULATORY_CARE_PROVIDER_SITE_OTHER): Payer: 59 | Admitting: Medical

## 2023-02-10 VITALS — BP 120/80 | HR 56 | Wt 204.6 lb

## 2023-02-10 DIAGNOSIS — E1165 Type 2 diabetes mellitus with hyperglycemia: Secondary | ICD-10-CM | POA: Diagnosis not present

## 2023-02-10 DIAGNOSIS — I10 Essential (primary) hypertension: Secondary | ICD-10-CM | POA: Diagnosis not present

## 2023-02-10 DIAGNOSIS — F419 Anxiety disorder, unspecified: Secondary | ICD-10-CM | POA: Diagnosis not present

## 2023-02-10 MED ORDER — MOUNJARO 2.5 MG/0.5ML ~~LOC~~ SOAJ: 2.5000 mg | SUBCUTANEOUS | 0 refills | Status: DC

## 2023-02-10 MED ORDER — TIRZEPATIDE 5 MG/0.5ML ~~LOC~~ SOAJ: 5.0000 mg | SUBCUTANEOUS | 0 refills | Status: DC
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
## 2023-02-10 NOTE — Patient Instructions (Addendum)
Recommendations: Begin Mounjaro 2.5mg  weekly for one month Then increase to [facility] 5mg  weekly After 1 month call back to give me update on sugars We want to avoid low sugars under 70 After 1 month, stop Metformin when you increase to Mounjaro 5mg  dose Continue other medications as usual

## 2023-02-26 NOTE — Progress Notes (Signed)
Please make sure that it gets abstract

## 2023-03-04 ENCOUNTER — Telehealth: Payer: Self-pay | Admitting: Family Medicine

## 2023-03-04 NOTE — Telephone Encounter (Signed)
Pt called and states you told him about some foot cream 2 years ago that worked really well for the places on his feet.  He wants to know the name of it.  I reviewed chart and can't find. Pt ph [phone]   you can also text him the name.

## 2023-03-08 NOTE — Telephone Encounter (Signed)
Unable to send MyChart message.

## 2023-03-08 NOTE — Telephone Encounter (Signed)
Notified pt and sent MyChart message.

## 2023-03-22 ENCOUNTER — Ambulatory Visit: Payer: 59 | Admitting: Medical

## 2023-03-22 ENCOUNTER — Encounter: Payer: Self-pay | Admitting: Medical

## 2023-03-22 VITALS — BP 114/72 | HR 57 | Ht 73.25 in | Wt 206.6 lb

## 2023-03-22 DIAGNOSIS — I10 Essential (primary) hypertension: Secondary | ICD-10-CM | POA: Diagnosis not present

## 2023-03-22 DIAGNOSIS — E1165 Type 2 diabetes mellitus with hyperglycemia: Secondary | ICD-10-CM | POA: Diagnosis not present

## 2023-03-22 DIAGNOSIS — E785 Hyperlipidemia, unspecified: Secondary | ICD-10-CM

## 2023-03-22 DIAGNOSIS — Z7185 Encounter for immunization safety counseling: Secondary | ICD-10-CM

## 2023-03-22 MED ORDER — METFORMIN HCL ER 750 MG PO TB24: ORAL_TABLET | ORAL | 1 refills | Status: DC

## 2023-03-22 MED ORDER — ROSUVASTATIN CALCIUM 20 MG PO TABS: 20.0000 mg | ORAL_TABLET | Freq: Every day | ORAL | 2 refills | Status: DC

## 2023-03-22 MED ORDER — TIRZEPATIDE 10 MG/0.5ML ~~LOC~~ SOAJ: 10.0000 mg | SUBCUTANEOUS | 1 refills | Status: DC

## 2023-03-22 MED ORDER — ASPIRIN 81 MG PO TBEC: 81.0000 mg | DELAYED_RELEASE_TABLET | Freq: Every day | ORAL | 3 refills | Status: DC

## 2023-03-22 MED ORDER — TIRZEPATIDE 7.5 MG/0.5ML ~~LOC~~ SOAJ: 7.5000 mg | SUBCUTANEOUS | 0 refills | Status: DC

## 2023-03-22 MED ORDER — EMPAGLIFLOZIN 10 MG PO TABS: 10.0000 mg | ORAL_TABLET | Freq: Every day | ORAL | 2 refills | Status: DC
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
## 2023-03-22 NOTE — Patient Instructions (Signed)
 Recommendations: Finish out Mounjaro  5 mg Then increase to Mounjaro  7.5 mg for 1 month Then increase to Mounjaro  10 mg for the next 2 months You can use metformin  if you are fasting sugars are staying over 120 I refilled aspirin  today and some of your other medications for 90-day supply Try to exercise at least 4 to 5 days/week    Recommendations for improving lipids:  Foods TO AVOID or limit - fried foods, high sugar foods, white bread, enriched flour, fast food, red meat, large amounts of cheese, processed foods such as little debbie cakes, cookies, pies, donuts, for example  Foods TO INCLUDE in the diet - whole grains such as whole grain pasta, whole grain bread, barley, steel cut oatmeal (not instant oatmeal), avocado, fish, green leafy vegetables, nuts, increased fiber in diet, and using olive oil in small amounts for cooking or as salad dressing vinaigrette.      Follow up in 3-4 months

## 2023-04-06 ENCOUNTER — Ambulatory Visit (INDEPENDENT_AMBULATORY_CARE_PROVIDER_SITE_OTHER): Payer: 59

## 2023-04-06 DIAGNOSIS — I422 Other hypertrophic cardiomyopathy: Secondary | ICD-10-CM

## 2023-04-06 DIAGNOSIS — I472 Ventricular tachycardia, unspecified: Secondary | ICD-10-CM

## 2023-04-06 LAB — CUP PACEART REMOTE DEVICE CHECK
Battery Remaining Longevity: 22 mo
Battery Remaining Percentage: 21 %
Battery Voltage: 2.78 V
Brady Statistic AP VP Percent: 1 %
Brady Statistic AP VS Percent: 16 %
Brady Statistic AS VP Percent: 1 %
Brady Statistic AS VS Percent: 84 %
Brady Statistic RA Percent Paced: 15 %
Brady Statistic RV Percent Paced: 1 %
Date Time Interrogation Session: 20250121020016
HighPow Impedance: 46 Ohm
HighPow Impedance: 46 Ohm
Lead Channel Impedance Value: 390 Ohm
Lead Channel Impedance Value: 450 Ohm
Lead Channel Pacing Threshold Amplitude: 1 V
Lead Channel Pacing Threshold Amplitude: 1.5 V
Lead Channel Pacing Threshold Pulse Width: 0.5 ms
Lead Channel Pacing Threshold Pulse Width: 0.5 ms
Lead Channel Sensing Intrinsic Amplitude: 3.1 mV
Lead Channel Sensing Intrinsic Amplitude: 7.8 mV
Lead Channel Setting Pacing Amplitude: 2 V
Lead Channel Setting Pacing Amplitude: 2.5 V
Lead Channel Setting Pacing Pulse Width: 0.5 ms
Lead Channel Setting Sensing Sensitivity: 0.5 mV
Pulse Gen Serial Number: 7208008

## 2023-05-17 ENCOUNTER — Other Ambulatory Visit: Payer: Self-pay | Admitting: Cardiology

## 2023-05-19 NOTE — Progress Notes (Signed)
 Remote ICD transmission.

## 2023-07-06 ENCOUNTER — Ambulatory Visit: Payer: 59

## 2023-07-06 DIAGNOSIS — I422 Other hypertrophic cardiomyopathy: Secondary | ICD-10-CM | POA: Diagnosis not present

## 2023-07-09 LAB — CUP PACEART REMOTE DEVICE CHECK
Battery Remaining Longevity: 19 mo
Battery Remaining Percentage: 18 %
Battery Voltage: 2.75 V
Brady Statistic AP VP Percent: 1 %
Brady Statistic AP VS Percent: 9.7 %
Brady Statistic AS VP Percent: 1 %
Brady Statistic AS VS Percent: 90 %
Brady Statistic RA Percent Paced: 9.2 %
Brady Statistic RV Percent Paced: 1 %
Date Time Interrogation Session: 20250424213714
HighPow Impedance: 44 Ohm
HighPow Impedance: 44 Ohm
Lead Channel Impedance Value: 360 Ohm
Lead Channel Impedance Value: 430 Ohm
Lead Channel Pacing Threshold Amplitude: 1 V
Lead Channel Pacing Threshold Amplitude: 1.5 V
Lead Channel Pacing Threshold Pulse Width: 0.5 ms
Lead Channel Pacing Threshold Pulse Width: 0.5 ms
Lead Channel Sensing Intrinsic Amplitude: 2.9 mV
Lead Channel Sensing Intrinsic Amplitude: 8.1 mV
Lead Channel Setting Pacing Amplitude: 2 V
Lead Channel Setting Pacing Amplitude: 2.5 V
Lead Channel Setting Pacing Pulse Width: 0.5 ms
Lead Channel Setting Sensing Sensitivity: 0.5 mV
Pulse Gen Serial Number: 7208008

## 2023-07-15 ENCOUNTER — Other Ambulatory Visit: Payer: Self-pay | Admitting: Medical

## 2023-07-15 NOTE — Telephone Encounter (Signed)
 Pt has an appt next week and he said he does not need a refill before appointment

## 2023-07-20 ENCOUNTER — Encounter: Payer: Self-pay | Admitting: Medical

## 2023-07-20 ENCOUNTER — Ambulatory Visit: Payer: 59 | Admitting: Medical

## 2023-07-20 VITALS — BP 102/64 | HR 68 | Ht 72.0 in | Wt 192.0 lb

## 2023-07-20 DIAGNOSIS — E785 Hyperlipidemia, unspecified: Secondary | ICD-10-CM

## 2023-07-20 DIAGNOSIS — I422 Other hypertrophic cardiomyopathy: Secondary | ICD-10-CM

## 2023-07-20 DIAGNOSIS — K5900 Constipation, unspecified: Secondary | ICD-10-CM

## 2023-07-20 DIAGNOSIS — I10 Essential (primary) hypertension: Secondary | ICD-10-CM

## 2023-07-20 DIAGNOSIS — Z23 Encounter for immunization: Secondary | ICD-10-CM | POA: Diagnosis not present

## 2023-07-20 DIAGNOSIS — E1165 Type 2 diabetes mellitus with hyperglycemia: Secondary | ICD-10-CM

## 2023-07-20 DIAGNOSIS — I472 Ventricular tachycardia, unspecified: Secondary | ICD-10-CM

## 2023-07-20 LAB — BASIC METABOLIC PANEL WITH GFR
BUN/Creatinine Ratio: 15 (ref 10–24)
BUN: 16 mg/dL (ref 8–27)
CO2: 20 mmol/L (ref 20–29)
Calcium: 9.4 mg/dL (ref 8.6–10.2)
Chloride: 101 mmol/L (ref 96–106)
Creatinine, Ser: 1.1 mg/dL (ref 0.76–1.27)
Glucose: 97 mg/dL (ref 70–99)
Potassium: 4.4 mmol/L (ref 3.5–5.2)
Sodium: 138 mmol/L (ref 134–144)
eGFR: 75 mL/min/{1.73_m2} (ref >59)

## 2023-07-20 LAB — HEMOGLOBIN A1C
Est. average glucose Bld gHb Est-mCnc: 108 mg/dL
Hgb A1c MFr Bld: 5.4 % (ref 4.8–5.6)

## 2023-07-20 MED ORDER — VASCEPA 1 G PO CAPS: ORAL_CAPSULE | ORAL | 5 refills | Status: DC

## 2023-07-20 MED ORDER — TIRZEPATIDE 10 MG/0.5ML ~~LOC~~ SOAJ: 10.0000 mg | SUBCUTANEOUS | 3 refills | Status: DC

## 2023-07-20 MED ORDER — ASPIRIN 81 MG PO TBEC: 81.0000 mg | DELAYED_RELEASE_TABLET | Freq: Every day | ORAL | 3 refills | Status: DC

## 2023-07-20 NOTE — Addendum Note (Signed)
 Addended by: Neysa Bares F on: 07/20/2023 09:35 AM   Modules accepted: Orders
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
## 2023-07-21 NOTE — Progress Notes (Signed)
 Diabetes marker shows 5.4% at goal, electrolytes and kidney okay.  As we discussed I would use only 1 tablet of Toprol -XL daily instead of 1-1/2 given the recent drop in pressure and dropping weight  Regarding the metformin  and Mounjaro, we can do one of the few options.  I want your input on this  1 option is to leave every thing the same since your hemoglobin A1c is so low at 5.4%  Another option is to bump up the metformin  as we discussed in case that helps with constipation, and either leave the Mounjaro at 10 or even possibly go down to 7.5 mg  Either 1 of these is an option.   I just do not want you to have low sugar readings under 70.

## 2023-07-24 ENCOUNTER — Other Ambulatory Visit: Payer: Self-pay | Admitting: Cardiology

## 2023-08-18 ENCOUNTER — Telehealth: Payer: Self-pay

## 2023-08-18 ENCOUNTER — Other Ambulatory Visit (HOSPITAL_COMMUNITY): Payer: Self-pay

## 2023-08-18 NOTE — Telephone Encounter (Signed)
 Pharmacy Patient Advocate Encounter  Insurance verification completed.   The patient is insured through [facility]   Ran test claim for Vascepa  1GM capsules. Currently a quantity of 120 is a 30 day supply and the co-pay is 60.00 . No P/A is needed. The pts pref'd pharmacy has been contacted to fill the Rx.      This test claim was processed through [facility]- copay amounts may vary at other pharmacies due to pharmacy/plan contracts, or as the patient moves through the different stages of their insurance plan.

## 2023-08-19 NOTE — Progress Notes (Signed)
 Remote ICD transmission.

## 2023-08-19 NOTE — Addendum Note (Signed)
 Addended by: Lott Rouleau A on: 08/19/2023 03:56 PM   Modules accepted: Orders

## 2023-08-20 ENCOUNTER — Other Ambulatory Visit: Payer: Self-pay | Admitting: Cardiology

## 2023-09-01 ENCOUNTER — Other Ambulatory Visit: Payer: Self-pay | Admitting: Cardiology

## 2023-09-11 ENCOUNTER — Other Ambulatory Visit: Payer: Self-pay | Admitting: Medical

## 2023-10-05 ENCOUNTER — Ambulatory Visit: Payer: 59

## 2023-10-05 DIAGNOSIS — I422 Other hypertrophic cardiomyopathy: Secondary | ICD-10-CM | POA: Diagnosis not present

## 2023-10-06 LAB — CUP PACEART REMOTE DEVICE CHECK
Battery Remaining Longevity: 13 mo
Battery Remaining Percentage: 14 %
Battery Voltage: 2.72 V
Brady Statistic AP VP Percent: 1 %
Brady Statistic AP VS Percent: 7.5 %
Brady Statistic AS VP Percent: 1 %
Brady Statistic AS VS Percent: 92 %
Brady Statistic RA Percent Paced: 7.1 %
Brady Statistic RV Percent Paced: 1 %
Date Time Interrogation Session: 20250722020016
HighPow Impedance: 46 Ohm
HighPow Impedance: 47 Ohm
Lead Channel Impedance Value: 390 Ohm
Lead Channel Impedance Value: 440 Ohm
Lead Channel Pacing Threshold Amplitude: 1 V
Lead Channel Pacing Threshold Amplitude: 1.5 V
Lead Channel Pacing Threshold Pulse Width: 0.5 ms
Lead Channel Pacing Threshold Pulse Width: 0.5 ms
Lead Channel Sensing Intrinsic Amplitude: 3.4 mV
Lead Channel Sensing Intrinsic Amplitude: 7.3 mV
Lead Channel Setting Pacing Amplitude: 2 V
Lead Channel Setting Pacing Amplitude: 2.5 V
Lead Channel Setting Pacing Pulse Width: 0.5 ms
Lead Channel Setting Sensing Sensitivity: 0.5 mV
Pulse Gen Serial Number: 7208008

## 2023-10-10 ENCOUNTER — Other Ambulatory Visit: Payer: Self-pay | Admitting: Medical

## 2023-10-11 ENCOUNTER — Ambulatory Visit: Payer: Self-pay | Admitting: Cardiology

## 2023-10-11 DIAGNOSIS — L814 Other melanin hyperpigmentation: Secondary | ICD-10-CM | POA: Insufficient documentation

## 2023-10-12 ENCOUNTER — Ambulatory Visit: Attending: Cardiology | Admitting: Cardiology

## 2023-10-12 ENCOUNTER — Other Ambulatory Visit (HOSPITAL_COMMUNITY): Payer: Self-pay

## 2023-10-12 ENCOUNTER — Telehealth: Payer: Self-pay

## 2023-10-12 ENCOUNTER — Encounter: Payer: Self-pay | Admitting: Cardiology

## 2023-10-12 VITALS — BP 99/65 | HR 67 | Ht 72.0 in | Wt 188.0 lb

## 2023-10-12 DIAGNOSIS — I10 Essential (primary) hypertension: Secondary | ICD-10-CM | POA: Diagnosis not present

## 2023-10-12 DIAGNOSIS — I422 Other hypertrophic cardiomyopathy: Secondary | ICD-10-CM | POA: Diagnosis not present

## 2023-10-12 DIAGNOSIS — E785 Hyperlipidemia, unspecified: Secondary | ICD-10-CM

## 2023-10-12 DIAGNOSIS — R0609 Other forms of dyspnea: Secondary | ICD-10-CM

## 2023-10-12 DIAGNOSIS — I472 Ventricular tachycardia, unspecified: Secondary | ICD-10-CM

## 2023-10-12 NOTE — Addendum Note (Signed)
 Addended by: ARLOA PLANAS D on: 10/12/2023 04:38 PM   Modules accepted: Orders
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
## 2023-10-12 NOTE — Telephone Encounter (Addendum)
 Pharmacy Patient Advocate Encounter   Received notification from [employer] that prior authorization for Vascepa  1GM capsules is required/requested.   Insurance verification completed.   The patient is insured through [facility] .   Per test claim: PA required; PA submitted to above mentioned insurance via CoverMyMeds Key/confirmation #/EOC (Key: [id number])     Status is pending

## 2023-10-12 NOTE — Patient Instructions (Addendum)
 Medication Instructions:  Your physician recommends that you continue on your current medications as directed. Please refer to the Current Medication list given to you today.  *If you need a refill on your cardiac medications before your next appointment, please call your pharmacy*   Lab Work: None Ordered If you have labs (blood work) drawn today and your tests are completely normal, you will receive your results only by: MyChart Message (if you have MyChart) OR A paper copy in the mail If you have any lab test that is abnormal or we need to change your treatment, we will call you to review the results.   Testing/Procedures: Your physician has requested that you have an echocardiogram. Echocardiography is a painless test that uses sound waves to create images of your heart. It provides your doctor with information about the size and shape of your heart and how well your heart's chambers and valves are working. This procedure takes approximately one hour. There are no restrictions for this procedure. Please do NOT wear cologne, perfume, aftershave, or lotions (deodorant is allowed). Please arrive 15 minutes prior to your appointment time.  Please note: We ask at that you not bring children with you during ultrasound (echo/ vascular) testing. Due to room size and safety concerns, children are not allowed in the ultrasound rooms during exams. Our front office staff cannot provide observation of children in our lobby area while testing is being conducted. An adult accompanying a patient to their appointment will only be allowed in the ultrasound room at the discretion of the ultrasound technician under special circumstances. We apologize for any inconvenience.    Follow-Up: At [facility], you and your health needs are our priority.  As part of our continuing mission to provide you with exceptional heart care, we have created designated Provider Care Teams.  These Care Teams include your  primary Cardiologist (physician) and Advanced Practice Providers (APPs -  Physician Assistants and Nurse Practitioners) who all work together to provide you with the care you need, when you need it.  We recommend signing up for the patient portal called MyChart.  Sign up information is provided on this After Visit Summary.  MyChart is used to connect with patients for Virtual Visits (Telemedicine).  Patients are able to view lab/test results, encounter notes, upcoming appointments, etc.  Non-urgent messages can be sent to your provider as well.   To learn more about what you can do with MyChart, go to [email or web address].    Your next appointment:   6 month(s)  The format for your next appointment:   In Person  Provider:   Lamar Fitch, MD    Other Instructions Referral to [facility]- they will call for appt

## 2023-10-13 ENCOUNTER — Other Ambulatory Visit (HOSPITAL_COMMUNITY): Payer: Self-pay

## 2023-10-13 NOTE — Telephone Encounter (Signed)
 Pharmacy Patient Advocate Encounter  Received notification from [employer] that Prior Authorization for Vascepa  1GM capsules has been APPROVED from 7.29.25 to 7.29.26. Ran test claim, Copay is $60.00. This test claim was processed through [facility]- copay amounts may vary at other pharmacies due to pharmacy/plan contracts, or as the patient moves through the different stages of their insurance plan.   PA #/Case ID/Reference #: (Key: [id number])

## 2023-10-23 ENCOUNTER — Other Ambulatory Visit: Payer: Self-pay | Admitting: Cardiology

## 2023-11-04 ENCOUNTER — Encounter: Payer: Self-pay | Admitting: Family Medicine

## 2023-11-04 ENCOUNTER — Ambulatory Visit: Admitting: Family Medicine

## 2023-11-04 ENCOUNTER — Ambulatory Visit: Payer: Self-pay

## 2023-11-04 VITALS — BP 98/68 | HR 60 | Temp 98.2°F | Ht 72.0 in | Wt 188.0 lb

## 2023-11-04 DIAGNOSIS — J302 Other seasonal allergic rhinitis: Secondary | ICD-10-CM | POA: Diagnosis not present

## 2023-11-04 DIAGNOSIS — R051 Acute cough: Secondary | ICD-10-CM

## 2023-11-04 LAB — POC COVID19 BINAXNOW: SARS Coronavirus 2 Ag: NEGATIVE

## 2023-11-04 NOTE — Patient Instructions (Signed)
 Your symptoms suggest a postnasal drip causing your cough, possibly due to a viral infection, more likely is from allergies. -Continue taking Mucinex DM twice daily. -Increase your water intake. -Consider using sinus rinses if head congestion worsens or any sinus pain. -If symptoms persist, you may use Flonase. We discussed how to use it and its side effects. We discussed gentle sniffs, 2 into each nostril once daily, with the potential to cut down to just 1 spray in each side when symptoms are improved. -Continue taking Zyrtec.  Contact us  if you develop fever, sinus pain, discolored mucus, persistent or worsening cough, shortness of breath, pain with breathing, or other concerns.

## 2023-11-04 NOTE — Telephone Encounter (Signed)
 FYI Only or Action Required?: FYI only for provider.  Patient was last seen in primary care on 07/20/2023 by Bulah Alm RAMAN, PA-C.  Called Nurse Triage reporting Cough.  Symptoms began a week ago.  Interventions attempted: OTC medications: Mucinex and Rest, hydration, or home remedies.  Symptoms are: unchanged.  Triage Disposition: See HCP Within 4 Hours (Or PCP Triage)  Patient/caregiver understands and will follow disposition?: Yes  Copied from CRM #[id number]. Topic: Clinical - Red Word Triage >> Nov 04, 2023  2:12 PM Kevelyn M wrote: Red Word that prompted transfer to Nurse Triage: Coughing with Phylegm, and shortness of breath. Started a week ago. Reason for Disposition  [1] MILD difficulty breathing (e.g., minimal/no SOB at rest, SOB with walking, pulse < 100) AND [2] still present when not coughing  Answer Assessment - Initial Assessment Questions 1. ONSET: When did the cough begin?      Started about a week ago 2. SEVERITY: How bad is the cough today?      6 out of 10 3. SPUTUM: Describe the color of your sputum (e.g., none, dry cough; clear, white, yellow, green)     Clear sputum 4. HEMOPTYSIS: Are you coughing up any blood? If Yes, ask: How much? (e.g., flecks, streaks, tablespoons, etc.)     no 5. DIFFICULTY BREATHING: Are you having difficulty breathing? If Yes, ask: How bad is it? (e.g., mild, moderate, severe)      mild 6. FEVER: Do you have a fever? If Yes, ask: What is your temperature, how was it measured, and when did it start?     no 7. CARDIAC HISTORY: Do you have any history of heart disease? (e.g., heart attack, congestive heart failure)      Enlarged heart, heart rhythm abnormality, defib/pacemaker 8. LUNG HISTORY: Do you have any history of lung disease?  (e.g., pulmonary embolus, asthma, emphysema)     no 9. PE RISK FACTORS: Do you have a history of blood clots? (or: recent major surgery, recent prolonged travel, bedridden)      no 10. OTHER SYMPTOMS: Do you have any other symptoms? (e.g., runny nose, wheezing, chest pain)       Runny nose 12. TRAVEL: Have you traveled out of the country in the last month? (e.g., travel history, exposures)       no  Protocols used: Cough - Acute Productive-A-AH

## 2023-11-04 NOTE — Progress Notes (Addendum)
 Chief Complaint  Patient presents with   Cough    Cough that started last Wed/Thursday, some SOB and has moved into his chest. Neg covid test Friday. Has been taking mucinex DM and zyrtec.    Nathan [name] is a 63 year old male with diabetes and hypertrophic cardiomyopathy who presents with worsening cough and congestion.  October 27, 2023, he started with symptoms which he initially thought could be allergies.  He took a COVID test 2 days later, which was negative.  He has a cough that is productive of white mucus, denies any discoloration.  He has runny nose off and on, with some congestion at night. Nasal drainage is clear.  He denies sinus pain.   He has a history of allergies. He started taking zyrtec and Mucinex DM 3 days ago.  These medications seem to help some, cough is worse at night.   He has some chest congestion and pressure.  Denies any dyspnea on exertion worse than baseline.  Denies exertional chest pain. He denies sick contacts, fevers.  He is a non-smoker.    PMH, PSH, SH reviewed  Outpatient Encounter Medications as of 11/04/2023  Medication Sig Note   cetirizine (ZYRTEC) 10 MG tablet Take 10 mg by mouth daily.    dextromethorphan-guaiFENesin (MUCINEX DM) 30-600 MG 12hr tablet Take 1 tablet by mouth 2 (two) times daily.    disopyramide  (NORPACE ) 100 MG capsule TAKE 1 CAPSULE BY MOUTH EVERY 8  HOURS    empagliflozin  (JARDIANCE ) 10 MG TABS tablet Take 1 tablet (10 mg total) by mouth daily.    Lancets (ONETOUCH DELICA PLUS LANCET30G) MISC USE TO TEST ONCE DAILY    metFORMIN  (GLUCOPHAGE -XR) 750 MG 24 hr tablet TAKE 1 TABLET BY MOUTH EVERY DAY WITH BREAKFAST    metoprolol  succinate (TOPROL -XL) 100 MG 24 hr tablet TAKE 1 TABLET BY MOUTH IN THE  MORNING WITH OR IMMEDIATELY  FOLLOWING A MEAL AND ONE-HALF  TABLET BY MOUTH IN THE EVENING    Multiple Vitamins-Minerals (MULTIVITAMIN ADULT) CHEW Chew 2 each by mouth daily.    ONETOUCH ULTRA test strip USE TO TEST ONCE DAILY     rosuvastatin  (CRESTOR ) 20 MG tablet Take 1 tablet (20 mg total) by mouth daily.    tirzepatide  (MOUNJARO ) 10 MG/0.5ML Pen Inject 10 mg into the skin once a week. 11/04/2023: Takes on Tuesday   aspirin  EC 81 MG tablet Take 1 tablet (81 mg total) by mouth 5 (five) times daily. SWALLOW WHOLE. (Patient not taking: Reported on 11/04/2023)    VASCEPA  1 g capsule TAKE 2 CAPSULES BY MOUTH TWICE A DAY (Patient not taking: Reported on 11/04/2023) 11/04/2023: Has been off x 3 days to pharmacy supply-[facility] has ordered   No facility-administered encounter medications on file as of 11/04/2023.   No Known Allergies  ROS: no fever, chills, ear pain, sinus pain, chest pain, n/v/d, bleeding/bruising/rash. See HPI    PHYSICAL EXAM:  BP 98/68   Pulse 60   Temp 98.2 F (36.8 C) (Tympanic)   Ht 6' (1.829 m)   Wt 188 lb (85.3 kg)   SpO2 97%   BMI 25.50 kg/m   Pleasant, well-appearing male in no distress. No sniffling, through clearing or coughing during visit. HEENT: L TM and EAC is normal. R TM is obscured by cerumen.   Nasal mucosa with mild edema on the left, no erythema or drainage, no purulence. Sinuses are nontender. OP is clear Heart: regular rate and rhythm Lungs:  clear to auscultation bilaterally. Neuro:  alert and oriented, cranial nerves grossly intact. Normal gait Psych: normal mood, affect, hygiene and grooming Skin normal turgor, no rash  COVID-19 test negative  ASSESSMENT/PLAN:  Seasonal allergic rhinitis, unspecified trigger - nothing to suggest bacterial infection. Supportive measures reviewed--cont zyrtec, mucinex DM. Consider sinus rinses, Flonase (instructed on proper use)  Acute cough - Plan: POC COVID-19  Acute upper respiratory symptoms--suspected due to allergic rhinitis (cannot rule out mild viral illness). Symptoms suggest postnasal drip causing cough. Differential includes viral infection versus allergic rhinitis. - Continue Mucinex DM twice daily. - Increase water  intake. - Consider sinus rinses if symptoms worsen. - Discussed Flonase use if symptoms persist, with instructions on use and side effects. - Continue Zyrtec.  Hypertrophic cardiomyopathy Avoid decongestants  Type 2 diabetes mellitus Well controlled with Mounjaro , Jardiance , and metformin .

## 2023-11-16 ENCOUNTER — Encounter: Payer: Self-pay | Admitting: Cardiology

## 2023-11-16 ENCOUNTER — Ambulatory Visit (INDEPENDENT_AMBULATORY_CARE_PROVIDER_SITE_OTHER): Admitting: Cardiology

## 2023-11-16 ENCOUNTER — Ambulatory Visit (HOSPITAL_BASED_OUTPATIENT_CLINIC_OR_DEPARTMENT_OTHER)
Admission: RE | Admit: 2023-11-16 | Discharge: 2023-11-16 | Disposition: A | Discharge status: Home / Self Care | Source: Ambulatory Visit | Attending: Cardiology | Admitting: Cardiology

## 2023-11-16 VITALS — BP 99/66 | HR 63 | Ht 72.0 in | Wt 185.5 lb

## 2023-11-16 DIAGNOSIS — R0609 Other forms of dyspnea: Secondary | ICD-10-CM | POA: Insufficient documentation

## 2023-11-16 DIAGNOSIS — I10 Essential (primary) hypertension: Secondary | ICD-10-CM

## 2023-11-16 DIAGNOSIS — I472 Ventricular tachycardia, unspecified: Secondary | ICD-10-CM

## 2023-11-16 DIAGNOSIS — I421 Obstructive hypertrophic cardiomyopathy: Secondary | ICD-10-CM

## 2023-11-16 LAB — ECHOCARDIOGRAM COMPLETE
AR max vel: 2.96 cm2
AV Area VTI: 2.86 cm2
AV Area mean vel: 2.98 cm2
AV Mean grad: 3 mmHg
AV Peak grad: 5.3 mmHg
Ao pk vel: 1.15 m/s
Area-P 1/2: 2.99 cm2
Calc EF: 59.6 %
MV M vel: 1.55 m/s
MV Peak grad: 9.6 mmHg
S' Lateral: 3.2 cm
Single Plane A2C EF: 55.2 %
Single Plane A4C EF: 63.1 %

## 2023-11-16 MED ORDER — PERFLUTREN LIPID MICROSPHERE
1.0000 mL | INTRAVENOUS | Status: AC | PRN
  Administered 2023-11-16: 2 mL via INTRAVENOUS

## 2023-11-16 NOTE — Progress Notes (Signed)
  Electrophysiology Office Note:   Date:  11/16/2023  ID:  Nathan [name], DOB 15-Mar-1961, MRN [id number]  Primary Cardiologist: Lamar Fitch, MD Primary Heart Failure: None Electrophysiologist: Amalio Loe Gladis Norton, MD      History of Present Illness:   Nathan [name] is a 63 y.o. male with h/o apical hypertrophic cardiomyopathy, diabetes, hypertension, hyperlipidemia seen today for routine electrophysiology followup.   Since last being seen in our clinic the patient reports doing overall well.  He is on Mounjaro  and has lost 70 pounds.  He feels like he has having more energy.  His diabetes continues to be well-controlled when he eats his normal diet..  he denies chest pain, palpitations, dyspnea, PND, orthopnea, nausea, vomiting, dizziness, syncope, edema, weight gain, or early satiety.   Review of systems complete and found to be negative unless listed in HPI.      EP Information / Studies Reviewed:    EKG is not ordered today. EKG from 10/12/2023 reviewed which showed sinus rhythm, right bundle branch block      ICD Interrogation-  reviewed in detail today,  See PACEART report.  Device History: Abbott Dual Chamber ICD implanted 11/23/2013 for hypertrophic cardiomyopathy History of appropriate therapy: No History of AAD therapy: No   Risk Assessment/Calculations:            Physical Exam:   VS:  BP 99/66 (BP Location: Left Arm, Patient Position: Sitting, Cuff Size: Normal)   Pulse 63   Ht 6' (1.829 m)   Wt 185 lb 8 oz (84.1 kg)   SpO2 98%   BMI 25.16 kg/m    Wt Readings from Last 3 Encounters:  11/16/23 185 lb 8 oz (84.1 kg)  11/04/23 188 lb (85.3 kg)  10/12/23 188 lb (85.3 kg)     GEN: Well nourished, well developed in no acute distress NECK: No JVD; No carotid bruits CARDIAC: Regular rate and rhythm, no murmurs, rubs, gallops RESPIRATORY:  Clear to auscultation without rales, wheezing or rhonchi  ABDOMEN: Soft, non-tender, non-distended EXTREMITIES:  No edema;  No deformity   ASSESSMENT AND PLAN:    Hypertrophic cardiomyopathy s/p Abbott dual chamber ICD  euvolemic today Stable on an appropriate medical regimen Normal ICD function See Pace Art report No changes today  2.  Hypertension: Well-controlled  3.  Ventricular tachycardia: Minimal noted on device interrogation.  Continue with current management.  Disposition:   Follow up with Dr. Norton in 12 months   Signed, Matisse Roskelley Gladis Norton, MD

## 2023-11-17 LAB — CUP PACEART INCLINIC DEVICE CHECK
Battery Remaining Longevity: 14 mo
Brady Statistic RA Percent Paced: 6.3 %
Brady Statistic RV Percent Paced: 0.12 %
Date Time Interrogation Session: 20250902161800
HighPow Impedance: 50 Ohm
HighPow Impedance: 50.1887
Implantable Lead Connection Status: 753985
Implantable Lead Connection Status: 753985
Implantable Lead Implant Date: 20150910
Implantable Lead Implant Date: 20150910
Implantable Lead Location: 753859
Implantable Lead Location: 753860
Implantable Pulse Generator Implant Date: 20150910
Lead Channel Impedance Value: 400 Ohm
Lead Channel Impedance Value: 450 Ohm
Lead Channel Pacing Threshold Amplitude: 0.75 V
Lead Channel Pacing Threshold Amplitude: 0.75 V
Lead Channel Pacing Threshold Amplitude: 1.5 V
Lead Channel Pacing Threshold Amplitude: 1.5 V
Lead Channel Pacing Threshold Pulse Width: 0.5 ms
Lead Channel Pacing Threshold Pulse Width: 0.5 ms
Lead Channel Pacing Threshold Pulse Width: 0.5 ms
Lead Channel Pacing Threshold Pulse Width: 0.5 ms
Lead Channel Sensing Intrinsic Amplitude: 3.7 mV
Lead Channel Sensing Intrinsic Amplitude: 7.7 mV
Lead Channel Setting Pacing Amplitude: 2 V
Lead Channel Setting Pacing Amplitude: 3 V
Lead Channel Setting Pacing Pulse Width: 0.5 ms
Lead Channel Setting Sensing Sensitivity: 0.5 mV
Pulse Gen Serial Number: 7208008

## 2023-11-19 ENCOUNTER — Ambulatory Visit: Payer: Self-pay | Admitting: Cardiology

## 2023-11-19 ENCOUNTER — Ambulatory Visit: Payer: 59 | Admitting: Medical

## 2023-11-19 ENCOUNTER — Encounter: Payer: Self-pay | Admitting: Medical

## 2023-11-19 VITALS — BP 118/70 | HR 56 | Ht 67.0 in | Wt 189.0 lb

## 2023-11-19 DIAGNOSIS — Z0000 Encounter for general adult medical examination without abnormal findings: Secondary | ICD-10-CM

## 2023-11-19 DIAGNOSIS — Z95810 Presence of automatic (implantable) cardiac defibrillator: Secondary | ICD-10-CM

## 2023-11-19 DIAGNOSIS — I10 Essential (primary) hypertension: Secondary | ICD-10-CM | POA: Diagnosis not present

## 2023-11-19 DIAGNOSIS — E1165 Type 2 diabetes mellitus with hyperglycemia: Secondary | ICD-10-CM

## 2023-11-19 DIAGNOSIS — Z23 Encounter for immunization: Secondary | ICD-10-CM

## 2023-11-19 DIAGNOSIS — E785 Hyperlipidemia, unspecified: Secondary | ICD-10-CM | POA: Diagnosis not present

## 2023-11-19 DIAGNOSIS — Z125 Encounter for screening for malignant neoplasm of prostate: Secondary | ICD-10-CM

## 2023-11-19 DIAGNOSIS — Z1211 Encounter for screening for malignant neoplasm of colon: Secondary | ICD-10-CM
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
## 2023-11-20 LAB — COMPREHENSIVE METABOLIC PANEL WITH GFR
ALT: 23 IU/L (ref 0–44)
AST: 26 IU/L (ref 0–40)
Albumin: 4.3 g/dL (ref 3.9–4.9)
Alkaline Phosphatase: 58 IU/L (ref 44–121)
BUN/Creatinine Ratio: 13 (ref 10–24)
BUN: 14 mg/dL (ref 8–27)
Bilirubin Total: 0.5 mg/dL (ref 0.0–1.2)
CO2: 22 mmol/L (ref 20–29)
Calcium: 9.3 mg/dL (ref 8.6–10.2)
Chloride: 102 mmol/L (ref 96–106)
Creatinine, Ser: 1.07 mg/dL (ref 0.76–1.27)
Globulin, Total: 2.7 g/dL (ref 1.5–4.5)
Glucose: 98 mg/dL (ref 70–99)
Potassium: 3.9 mmol/L (ref 3.5–5.2)
Sodium: 139 mmol/L (ref 134–144)
Total Protein: 7 g/dL (ref 6.0–8.5)
eGFR: 77 mL/min/1.73 (ref >59)

## 2023-11-20 LAB — URINALYSIS, ROUTINE W REFLEX MICROSCOPIC
Bilirubin, UA: NEGATIVE
Ketones, UA: NEGATIVE
Leukocytes,UA: NEGATIVE
Nitrite, UA: NEGATIVE
Protein,UA: NEGATIVE
RBC, UA: NEGATIVE
Specific Gravity, UA: 1.01 (ref 1.005–1.030)
Urobilinogen, Ur: 0.2 mg/dL (ref 0.2–1.0)
pH, UA: 6.5 (ref 5.0–7.5)

## 2023-11-20 LAB — CBC
Hematocrit: 46.7 % (ref 37.5–51.0)
Hemoglobin: 15.4 g/dL (ref 13.0–17.7)
MCH: 31.4 pg (ref 26.6–33.0)
MCHC: 33 g/dL (ref 31.5–35.7)
MCV: 95 fL (ref 79–97)
Platelets: 226 x10E3/uL (ref 150–450)
RBC: 4.9 x10E6/uL (ref 4.14–5.80)
RDW: 12.8 % (ref 11.6–15.4)
WBC: 7.1 x10E3/uL (ref 3.4–10.8)

## 2023-11-20 LAB — LIPID PANEL
Chol/HDL Ratio: 2.4 ratio (ref 0.0–5.0)
Cholesterol, Total: 87 mg/dL — ABNORMAL LOW (ref 100–199)
HDL: 36 mg/dL — ABNORMAL LOW (ref >39)
LDL Chol Calc (NIH): 38 mg/dL (ref 0–99)
Triglycerides: 56 mg/dL (ref 0–149)
VLDL Cholesterol Cal: 13 mg/dL (ref 5–40)

## 2023-11-20 LAB — HEMOGLOBIN A1C
Est. average glucose Bld gHb Est-mCnc: 111 mg/dL
Hgb A1c MFr Bld: 5.5 % (ref 4.8–5.6)

## 2023-11-20 LAB — PSA: Prostate Specific Ag, Serum: 0.8 ng/mL (ref 0.0–4.0)

## 2023-11-20 LAB — MICROALBUMIN / CREATININE URINE RATIO
Creatinine, Urine: 29 mg/dL
Microalb/Creat Ratio: <10 mg/g{creat} (ref 0–29)
Microalbumin, Urine: <3 ug/mL

## 2023-11-21 ENCOUNTER — Other Ambulatory Visit: Payer: Self-pay | Admitting: Medical

## 2023-11-21 ENCOUNTER — Ambulatory Visit: Payer: Self-pay | Admitting: Medical

## 2023-11-21 MED ORDER — EMPAGLIFLOZIN 10 MG PO TABS: 10.0000 mg | ORAL_TABLET | Freq: Every day | ORAL | 2 refills | Status: AC

## 2023-11-21 MED ORDER — METFORMIN HCL ER 750 MG PO TB24: ORAL_TABLET | ORAL | 2 refills | Status: AC

## 2023-11-21 MED ORDER — ROSUVASTATIN CALCIUM 20 MG PO TABS: 20.0000 mg | ORAL_TABLET | Freq: Every day | ORAL | 2 refills | Status: AC

## 2023-11-21 MED ORDER — TIRZEPATIDE 10 MG/0.5ML ~~LOC~~ SOAJ: 10.0000 mg | SUBCUTANEOUS | 3 refills | Status: AC

## 2023-11-21 NOTE — Progress Notes (Signed)
 Results thru my chart  Complete his insurance form

## 2023-11-26 ENCOUNTER — Ambulatory Visit: Admitting: Medical

## 2023-11-26 ENCOUNTER — Ambulatory Visit: Payer: Self-pay | Admitting: Cardiology

## 2023-11-26 VITALS — BP 104/64 | HR 66 | Wt 186.8 lb

## 2023-11-26 DIAGNOSIS — H6121 Impacted cerumen, right ear: Secondary | ICD-10-CM

## 2023-11-26 DIAGNOSIS — H9191 Unspecified hearing loss, right ear: Secondary | ICD-10-CM

## 2023-11-26 NOTE — Progress Notes (Signed)
 Subjective:   Here for decreased hearing, possible wax.  Complaint of earwax buildup in right ear.  No other aggravating or relieving factors.  No other complaint.  Review of Systems Constitutional: denies fever, chills, sweats ENT: no runny nose, ear pain, sore throat, hoarseness, sinus pain, teeth pain, tinnitus, hearing loss Gastroenterology: denies nausea, vomiting     Objective:   Physical Exam  General appearance: alert, no distress, WD/WN Ears: right ear canal with impacted cerumen, left ear canal normal.       Assessment & Plan:    Encounter Diagnoses  Name Primary?   Decreased hearing of right ear Yes   Impacted cerumen of right ear      Discussed findings.  Discussed risk/benefits of procedure and patient agrees to procedure. Successfully used warm water lavage to remove impacted cerumen from right ear canal. Patient tolerated procedure well. Advised they avoid using any cotton swabs or other devices to clean the ear canals.  Use basic hygiene as discussed.  Follow up prn.

## 2023-11-30 ENCOUNTER — Other Ambulatory Visit: Payer: Self-pay | Admitting: Cardiology

## 2023-11-30 MED ORDER — SACUBITRIL-VALSARTAN 24-26 MG PO TABS: 1.0000 | ORAL_TABLET | Freq: Two times a day (BID) | ORAL | 12 refills | Status: DC

## 2023-12-01 NOTE — Telephone Encounter (Signed)
 Pt of Dr. Krasowski. Please address this Pharmacy request.

## 2023-12-13 ENCOUNTER — Telehealth: Payer: Self-pay | Admitting: Medical

## 2023-12-13 NOTE — Telephone Encounter (Signed)
 Copied from CRM #[id number]. Topic: General - Other >> Dec 13, 2023 12:51 PM [name] wrote: Reason for CRM: The patient called in stating there is paperwork that was supposed to be turned in so he gets yearly discounts. He said his insurance [employer] has received it yet. He would like a call to explain further if necessary and to see where the status is on it. Please assist patient further.  Form was faxed in on 11/21/23 and I faxed it again today. Spoke to Nathan [name] and made him aware and he will call back if further issues

## 2023-12-17 NOTE — Progress Notes (Signed)
 Remote ICD Transmission

## 2024-01-04 ENCOUNTER — Ambulatory Visit: Payer: 59 | Attending: Cardiovascular Disease

## 2024-01-04 DIAGNOSIS — I422 Other hypertrophic cardiomyopathy: Secondary | ICD-10-CM | POA: Diagnosis not present

## 2024-01-05 LAB — CUP PACEART REMOTE DEVICE CHECK
Battery Remaining Longevity: 10 mo
Battery Remaining Percentage: 10 %
Battery Voltage: 2.68 V
Brady Statistic AP VP Percent: 1 %
Brady Statistic AP VS Percent: 1.9 %
Brady Statistic AS VP Percent: 1 %
Brady Statistic AS VS Percent: 98 %
Brady Statistic RA Percent Paced: 1.5 %
Brady Statistic RV Percent Paced: 1 %
Date Time Interrogation Session: 20251021020016
HighPow Impedance: 46 Ohm
HighPow Impedance: 46 Ohm
Lead Channel Impedance Value: 390 Ohm
Lead Channel Impedance Value: 480 Ohm
Lead Channel Pacing Threshold Amplitude: 0.75 V
Lead Channel Pacing Threshold Amplitude: 1.5 V
Lead Channel Pacing Threshold Pulse Width: 0.5 ms
Lead Channel Pacing Threshold Pulse Width: 0.5 ms
Lead Channel Sensing Intrinsic Amplitude: 3.4 mV
Lead Channel Sensing Intrinsic Amplitude: 7.5 mV
Lead Channel Setting Pacing Amplitude: 2 V
Lead Channel Setting Pacing Amplitude: 3 V
Lead Channel Setting Pacing Pulse Width: 0.5 ms
Lead Channel Setting Sensing Sensitivity: 0.5 mV
Pulse Gen Serial Number: 7208008

## 2024-01-07 NOTE — Progress Notes (Signed)
 Remote ICD Transmission

## 2024-01-11 ENCOUNTER — Ambulatory Visit: Payer: Self-pay | Admitting: Cardiology

## 2024-01-17 ENCOUNTER — Ambulatory Visit: Attending: Internal Medicine | Admitting: Internal Medicine

## 2024-01-17 ENCOUNTER — Other Ambulatory Visit: Payer: Self-pay

## 2024-01-17 ENCOUNTER — Other Ambulatory Visit (HOSPITAL_COMMUNITY): Payer: Self-pay

## 2024-01-17 ENCOUNTER — Ambulatory Visit: Admitting: Genetic Counselor

## 2024-01-17 VITALS — BP 112/73 | HR 60 | Ht 72.0 in | Wt 184.0 lb

## 2024-01-17 DIAGNOSIS — Z95810 Presence of automatic (implantable) cardiac defibrillator: Secondary | ICD-10-CM | POA: Diagnosis not present

## 2024-01-17 DIAGNOSIS — I422 Other hypertrophic cardiomyopathy: Secondary | ICD-10-CM

## 2024-01-17 DIAGNOSIS — I472 Ventricular tachycardia, unspecified: Secondary | ICD-10-CM

## 2024-01-17 MED ORDER — LORAZEPAM 0.5 MG PO TABS: 0.5000 mg | ORAL_TABLET | Freq: Once | ORAL | 0 refills | Status: AC

## 2024-01-17 MED ORDER — LORAZEPAM 0.5 MG PO TABS
0.5000 mg | ORAL_TABLET | Freq: Every day | ORAL | 0 refills | Status: DC
  Filled 2024-01-17: qty 1, 1d supply, fill #0

## 2024-01-17 MED ORDER — APIXABAN 5 MG PO TABS: 5.0000 mg | ORAL_TABLET | Freq: Two times a day (BID) | ORAL | 11 refills | Status: AC

## 2024-01-17 NOTE — Progress Notes (Signed)
 Cardiology Office Note:  .    Date:  01/17/2024  ID:  Ozell Jaeger, DOB 03/04/1961, MRN [id number] PCP: Bulah Alm GORMAN DEVONNA  [facility] Providers Cardiologist:  Stanly DELENA Leavens, MD Electrophysiologist:  Will Gladis Norton, MD     CC: HCM Consulted for the evaluation of ApHCM at the behest of Dr. Bernie  History of Present Illness: Nathan [name]    [name] is a 63 y.o. male with apical hypertrophic cardiomyopathy who presents for evaluation of his cardiac condition and management of associated symptoms. He was referred by Dr. MARLA for evaluation of his cardiac condition.  He has a history of apical hypertrophic cardiomyopathy with an apical aneurysm and heart failure with mildly reduced ejection fraction. He is status post-ICD placement. Recently, he experienced a weight loss of 70 pounds and generally feels well, although he notes feeling more tired and occasionally has difficulty catching his breath. He experiences occasional fatigue and difficulty catching his breath, which he attributes to his condition and medication regimen.  Felt like he hit a bit of a wall.   He is currently on several medications including norpace , which he has been taking for the past ten years following an event of rapid heart rate and sweating. Initially, he was on a time-release formulation but switched to a three times daily regimen due to supply issues. He also takes Jardiance  (empagliflozin ) to manage fluid retention and has been prescribed but not started on Entresto  due to insurance issues.  His family history includes a paternal cousin who died of a similar condition and a paternal grandfather who passed away at 76 from a 'bad heart'. He has a brother and two children who may require screening for hypertrophic cardiomyopathy.  Socially, he works as a technical sales engineer and plans to continue working until his wife retires in five to six years. He describes his lifestyle as sedentary but  mentions going on walks with his new dog two to three times a week for about 20 minutes.  During the review of symptoms, he reports feeling more tired and fatigued over the past six months, with occasional low blood pressure readings. No dry eyes, dry nose, dry mouth, or urinary retention, which are common side effects of disapiramide.  Discussed the use of AI scribe software for clinical note transcription with the patient, who gave verbal consent to proceed.   Relevant histories: .  Social  - Tobacco: Denies smoking - Employment: Real psychologist, occupational - Living Situation: Lives with wife - Patient is active with his dog and plans to work until his wife midwife. He has a daughter, a son in transition, and a brother. He has lost 70 pounds and is experiencing some fatigue and low blood pressure.  ROS: As per HPI.   Studies Reviewed: .     Cardiac Studies & Procedures   ______________________________________________________________________________________________     ECHOCARDIOGRAM  ECHOCARDIOGRAM COMPLETE 11/16/2023  Narrative ECHOCARDIOGRAM REPORT    Patient Name:   Nathan [name] Date of Exam: 11/16/2023 Medical Rec #:  [id number]     Height:       72.0 in Accession #:    [id number]    Weight:       188.0 lb Date of Birth:  1960-09-02     BSA:          2.075 m Patient Age:    63 years      BP:           98/68 mmHg Patient  Gender: M             HR:           57 bpm. Exam Location:  [place]  Procedure: 2D Echo, 3D Echo, Cardiac Doppler, Color Doppler and Intracardiac Opacification Agent (Both Spectral and Color Flow Doppler were utilized during procedure).  Indications:    R06.9 DOE  History:        Patient has prior history of Echocardiogram examinations, most recent 11/21/2021. Cardiomyopathy and Hypertrophic Cardiomyopathy, Defibrillator; Risk Factors:Hypertension, Dyslipidemia and Non-Smoker.  Sonographer:    Alan Greenhouse RDMS, RVT, RDCS Referring Phys: [phone]  LAMAR PARAS KRASOWSKI  IMPRESSIONS   1. Possible LV apical mural thrombus. Left ventricular ejection fraction, by estimation, is 40 to 45%. The left ventricle has mildly decreased function. The left ventricle demonstrates regional wall motion abnormalities (see scoring diagram/findings for description). Left ventricular diastolic parameters are consistent with Grade II diastolic dysfunction (pseudonormalization). The average left ventricular global longitudinal strain is -15.0 %. The global longitudinal strain is abnormal. 2. Right ventricular systolic function is mildly reduced. The right ventricular size is normal. 3. Trivial mitral valve regurgitation. No evidence of mitral stenosis. 4. The aortic valve is tricuspid. Aortic valve regurgitation is not visualized. No aortic stenosis is present. 5. The inferior vena cava is normal in size with greater than 50% respiratory variability, suggesting right atrial pressure of 3 mmHg.  Comparison(s): Echocardiogram done 11/21/21 reported an EF of 60-65% with similar apical wall motion abnormality and reduced contrast motion in the apex.  Conclusion(s)/Recommendation(s): Aneurysmal LV apex with reduced contrast motion and appearance highly suspicious for layered mural thrombus. Consider cardiac MRI for further evaluation. Will message ordering provider so as to review this promptly and have low threshold for anticoagulation.  FINDINGS Left Ventricle: Possible LV apical mural thrombus. Left ventricular ejection fraction, by estimation, is 40 to 45%. The left ventricle has mildly decreased function. The left ventricle demonstrates regional wall motion abnormalities. Definity  contrast agent was given IV to delineate the left ventricular endocardial borders. The average left ventricular global longitudinal strain is -15.0 %. Strain was performed and the global longitudinal strain is abnormal. The left ventricular internal cavity size was normal in size. There is  no left ventricular hypertrophy. Left ventricular diastolic parameters are consistent with Grade II diastolic dysfunction (pseudonormalization).   LV Wall Scoring: The entire apex is aneurysmal. The anterior wall, antero-lateral wall, anterior septum, inferior wall, posterior wall, mid inferoseptal segment, and basal inferoseptal segment are normal.  Right Ventricle: The right ventricular size is normal. Right vetricular wall thickness was not well visualized. Right ventricular systolic function is mildly reduced.  Left Atrium: Left atrial size was normal in size.  Right Atrium: Device lead visualized. Right atrial size was normal in size.  Pericardium: There is no evidence of pericardial effusion.  Mitral Valve: Mild mitral annular calcification. Trivial mitral valve regurgitation. No evidence of mitral valve stenosis.  Tricuspid Valve: The tricuspid valve is grossly normal. Tricuspid valve regurgitation is trivial. No evidence of tricuspid stenosis.  Aortic Valve: The aortic valve is tricuspid. Aortic valve regurgitation is not visualized. No aortic stenosis is present. Aortic valve mean gradient measures 3.0 mmHg. Aortic valve peak gradient measures 5.3 mmHg. Aortic valve area, by VTI measures 2.86 cm.  Pulmonic Valve: The pulmonic valve was not well visualized. Pulmonic valve regurgitation is trivial. No evidence of pulmonic stenosis.  Aorta: The aortic root and ascending aorta are structurally normal, with no evidence of dilitation.  Venous: The  inferior vena cava is normal in size with greater than 50% respiratory variability, suggesting right atrial pressure of 3 mmHg.  IAS/Shunts: The interatrial septum was not well visualized.  Additional Comments: A device lead is visualized.   LEFT VENTRICLE PLAX 2D LVIDd:         5.20 cm     Diastology LVIDs:         3.20 cm     LV e' medial:    5.55 cm/s LV PW:         1.30 cm     LV E/e' medial:  14.3 LV IVS:        0.90 cm      LV e' lateral:   9.79 cm/s LVOT diam:     2.10 cm     LV E/e' lateral: 8.1 LV SV:         79 LV SV Index:   38          2D Longitudinal Strain LVOT Area:     3.46 cm    2D Strain GLS (A4C):   -15.0 % 2D Strain GLS (A3C):   -14.1 % 2D Strain GLS (A2C):   -15.9 % LV Volumes (MOD)           2D Strain GLS Avg:     -15.0 % LV vol d, MOD A2C: 76.6 ml LV vol d, MOD A4C: 83.1 ml LV vol s, MOD A2C: 34.3 ml LV vol s, MOD A4C: 30.7 ml 3D Volume EF: LV SV MOD A2C:     42.3 ml 3D EF:        55 % LV SV MOD A4C:     83.1 ml LV EDV:       205 ml LV SV MOD BP:      47.8 ml LV ESV:       92 ml LV SV:        113 ml  RIGHT VENTRICLE RV S prime:     5.66 cm/s TAPSE (M-mode): 1.4 cm  LEFT ATRIUM             Index        RIGHT ATRIUM           Index LA diam:        3.50 cm 1.69 cm/m   RA Area:     15.50 cm LA Vol (A2C):   38.1 ml 18.36 ml/m  RA Volume:   39.60 ml  19.08 ml/m LA Vol (A4C):   45.5 ml 21.92 ml/m LA Biplane Vol: 44.2 ml 21.30 ml/m AORTIC VALVE AV Area (Vmax):    2.96 cm AV Area (Vmean):   2.98 cm AV Area (VTI):     2.86 cm AV Vmax:           115.00 cm/s AV Vmean:          87.300 cm/s AV VTI:            0.277 m AV Peak Grad:      5.3 mmHg AV Mean Grad:      3.0 mmHg LVOT Vmax:         98.20 cm/s LVOT Vmean:        75.200 cm/s LVOT VTI:          0.229 m LVOT/AV VTI ratio: 0.83  AORTA Ao Root diam: 3.10 cm Ao Asc diam:  2.90 cm  MITRAL VALVE MV Area (PHT): 2.99 cm    SHUNTS MV Decel Time: 254  msec    Systemic VTI:  0.23 m MR Peak grad: 9.6 mmHg     Systemic Diam: 2.10 cm MR Vmax:      155.00 cm/s MV E velocity: 79.10 cm/s MV A velocity: 69.70 cm/s MV E/A ratio:  1.13  Sreedhar reddy Madireddy Electronically signed by Alean reddy Madireddy Signature Date/Time: 11/16/2023/5:56:24 PM    Final        CARDIAC MRI  MR CARDIAC MORPHOLOGY W WO CONTRAST 03/03/2012  Narrative *RADIOLOGY REPORT*  Clinical Data: Assess for apical hypertrophic  cardiomyopathy  MR CARDIA MORPHOLOGY WITHOUT AND WITH CONTRAST  GE 1.5 T magnet with dedicated cardiac coil.  FIESTA sequences for function and morphology.  10 minutes after 30 mL Multihance  contrast was injected, inversion recovery sequences were done to assess for myocardial delayed enhancement.  EF was calculated at a dedicated workstation.  Contrast: 30mL MULTIHANCE  GADOBENATE DIMEGLUMINE  529 MG/ML IV SOLN  Comparison: None.  Findings: There was prominent hypertrophy of the mid septum, mid inferior wall, and mid anterior wall, less prominent in the lateral wall.  The hypertrophy extended into the apical segments.  The true apex and the apical lateral wall were thin and akinetic. There was near mid-cavity obliteration with systole.  There appeared to be some LV outflow tract turbulence but no SAM. Overall EF was normal, 60%.  The right ventricle was normal in size and systolic function. Normal right atrial size.  Borderline left atrial enlargement.  On delayed enhancement imaging, there was mid anteroseptal mid wall and epicardial enhancement.  The peri-apical LV had extensive, near full thickness enhancement.  Measurements:  LV EDV 163 mL  LV SV 98 mL  LV EF 60%  IMPRESSION: This appears to be a variant of apical hypertrophic cardiomyopathy with extensive hypertrophy of the mid to apical LV wall segments but thinning and akinesis of the true apex and apical lateral wall. There is near mid-cavity obliteration with systole. There is an extensive scar pattern in the peri-apical segments.   Original Report Authenticated By: Ezra Shuck   ______________________________________________________________________________________________      Physical Exam:    VS:  BP 112/73   Pulse 60   Ht 6' (1.829 m)   Wt 184 lb (83.5 kg)   SpO2 97%   BMI 24.95 kg/m    Wt Readings from Last 3 Encounters:  01/17/24 184 lb (83.5 kg)  11/26/23 186 lb 12.8 oz (84.7 kg)  11/19/23  189 lb (85.7 kg)    Gen: no distress   Neck: No JVD Cardiac: No Rubs or Gallops, no murmur, RRR +2 radial pulses Respiratory: Clear to auscultation bilaterally, normal effort, normal  respiratory rate GI: Soft, nontender, non-distended  MS: No  edema;  moves all extremities Integument: Skin feels warm Neuro:  At time of evaluation, alert and oriented to person/place/time/situation  Psych: Normal affect, patient feels ok    ASSESSMENT AND PLAN: .    Hypertrophic Cardiomyopathy - Mid Cavitary with Apical aneurysm Variant - with LV apical sludge and HFrEF - suspicion of Fabry's/Danon/Noonan's or other mimics of HCM: Low - Gene variant: MYL2 VUS - Indexed assessment: [email or web address] - NYHA II - Biomarkers  - Non HCM Contributors to disease/status  Heart failure with mildly reduced ejection fraction Mildly reduced ejection fraction with symptoms of fatigue and exertional dyspnea. No significant fluid overload. Current medications include Jardiance  for fluid management. Consideration of spironolactone if symptoms persist. Exercise tolerance to be assessed with treadmill test. - Ordered exercise treadmill test to assess exercise tolerance -  Will consider spironolactone if symptoms persist( no benefit from ivabradine could consider vericiguat)  - we discussed increasing exercise and will make full recommendations for exercise and increased activity based on that  History of sustained ventricular tachycardia, status post ICD - AHA SCD risk NA (VT; apical aneurysm) Sustained ventricular tachycardia with ICD in place. No recent episodes of VT requiring ICD intervention. Current management with disopyramide  is effective and asymptomatic. No changes to current antiarrhythmic regimen. - Continue disopyramide  as current antiarrhythmic regimen for now; continue AV nodal thearpy - follows with EP  Hypercholesterolemia Managed with rosuvastatin . Labs in September were great. -  Continue current therapy for cholesterol management  Family history, Discussed family screening  - echo screening for brother and two kids  Atrial fibrillation Assessment  - HCM-AF score NA in the presence of apical aneurysm  - we discussed AC today in review of his echo and his stroke risk  Medication symptom plan - Mid cavitary hypertrophic cardiomyopathy with apical aneurysm. High risk of atrial fibrillation and stroke due to apical aneurysm. Low flow state in the apex with risk of thrombus formation. No clear thrombus on echocardiogram, but cardiac MRI ordered to rule out thrombus. Moderate risk of atrial fibrillation and stroke without conventional cause. Risk of bleeding with anticoagulation discussed. Benefits of anticoagulation outweigh risks due to high risk of thrombus formation. - Started Eliquis 5 mg PO BID - Ordered cardiac MRI to assess for thrombus - Ordered CBC in two weeks to monitor blood counts - Provided anxiety management for MRI  Apical Hypertrophic Cardiomyopathy with Apical Aneurysm Management  - Diagnosis confirmed by:   Echocardiography   - Aneurysm size: greater than 1.94 cm2  Treatment plan:  1. Medical management:  Metoprolol , AC start, and s/p ICD as above  Thromboembolic risk assessment:  Anticoagulation initiated         Pending POET vs CMR f/u has Dr. MARLA f/u in the Spring  One year with me or Orren unless new issues  Time:   I have spent a total of 64 minutes with the patient reviewing notes, imaging, EKGs, labs, and examining the patient as well as establishing an assessment and plan that was discussed personally with the patient. Discussed disease state education , using shared decision making tools and cardiac modeling , and showed his echo to him. Reviewed care and plan in collaboration with his clinica geneticist   Stanly Leavens, MD FASE [facility] Cardiologist [facility]  [facility]  [address] [place], [address] [place], [place]  [address] [phone]  8:05 AM

## 2024-01-17 NOTE — Patient Instructions (Signed)
 Medication Instructions:  Your physician has recommended you make the following change in your medication:  1) START taking Eliquis 5 mg twice daily   *If you need a refill on your cardiac medications before your next appointment, please call your pharmacy*  Lab Work: CBC - in two weeks. You may go to any [facility] to have this drawn.   Testing/Procedures: Exercise Tolerance Test Your physician has requested that you have an exercise tolerance test. For further information please visit [email or web address] Please also follow instruction sheet, as given.  Cardiac MRI  Your physician has requested that you have a cardiac MRI. Cardiac MRI uses a computer to create images of your heart as its beating, producing both still and moving pictures of your heart and major blood vessels. For further information please visit [email or web address]. Please follow the instruction sheet given to you today for more information.   Follow-Up: At [facility], you and your health needs are our priority.  As part of our continuing mission to provide you with exceptional heart care, our providers are all part of one team.  This team includes your primary Cardiologist (physician) and Advanced Practice Providers or APPs (Physician Assistants and Nurse Practitioners) who all work together to provide you with the care you need, when you need it.  Your next appointment:   1 year  Provider:   Stanly DELENA Leavens, MD or Orren Fabry, PA-C    Other Instructions   You are scheduled for Cardiac MRI at the location below.  Please arrive for your appointment at ______________ . ?  [facility] [address] [place], [place] [address] Please take advantage of the free valet parking available at the [facility] and [employer] (Entrance C).  Proceed to the [facility] Radiology Department (First Floor) for check-in.   OR   [facility] [address] [place], [place]  [address] Please go to the [facility] and check-in with the desk attendant.   Magnetic resonance imaging (MRI) is a painless test that produces images of the inside of the body without using Xrays.  During an MRI, strong magnets and radio waves work together in a data processing manager to form detailed images.   MRI images may provide more details about a medical condition than X-rays, CT scans, and ultrasounds can provide.  You may be given earphones to listen for instructions.  You may eat a light breakfast and take medications as ordered with the exception of furosemide, hydrochlorothiazide, chlorthalidone or spironolactone (or any other fluid pill). If you are undergoing a stress MRI, please avoid stimulants for 12 hr prior to test. (I.e. Caffeine, nicotine, chocolate, or antihistamine medications)  If your provider has ordered anti-anxiety medications for this test, then you will need a driver.  An IV will be inserted into one of your veins. Contrast material will be injected into your IV. It will leave your body through your urine within a day. You may be told to drink plenty of fluids to help flush the contrast material out of your system.  You will be asked to remove all metal, including: Watch, jewelry, and other metal objects including hearing aids, hair pieces and dentures. Also wearable glucose monitoring systems (ie. Freestyle Libre and Omnipods) (Braces and fillings normally are not a problem.)   TEST WILL TAKE APPROXIMATELY 1 HOUR  PLEASE NOTIFY SCHEDULING AT LEAST 24 HOURS IN ADVANCE IF YOU ARE UNABLE TO KEEP YOUR APPOINTMENT. [phone]  For more information and frequently asked  questions, please visit our website : [email or web address]  Please call the Cardiac Imaging Nurse Navigators with any questions/concerns. [phone] Office

## 2024-02-08 ENCOUNTER — Encounter: Payer: Self-pay | Admitting: Physician Assistant

## 2024-02-08 ENCOUNTER — Other Ambulatory Visit: Payer: Self-pay | Admitting: Medical

## 2024-02-09 ENCOUNTER — Telehealth (HOSPITAL_COMMUNITY): Payer: Self-pay | Admitting: *Deleted

## 2024-02-09 NOTE — Telephone Encounter (Signed)
 Reminder call given for upcoming GXT on 02/18/24 at 10:30.

## 2024-02-12 ENCOUNTER — Encounter: Payer: Self-pay | Admitting: Internal Medicine

## 2024-02-18 ENCOUNTER — Ambulatory Visit (HOSPITAL_COMMUNITY)
Admission: RE | Admit: 2024-02-18 | Discharge: 2024-02-18 | Disposition: A | Source: Ambulatory Visit | Attending: Internal Medicine

## 2024-02-18 DIAGNOSIS — I422 Other hypertrophic cardiomyopathy: Secondary | ICD-10-CM

## 2024-02-18 DIAGNOSIS — I472 Ventricular tachycardia, unspecified: Secondary | ICD-10-CM

## 2024-02-18 LAB — EXERCISE TOLERANCE TEST
Angina Index: 0
Duke Treadmill Score: 10
Estimated workload: 10.9
Exercise duration (min): 9 min
Exercise duration (sec): 31 s
MPHR: 155 {beats}/min
Peak HR: 109 {beats}/min
Percent HR: 70 %
RPE: 18
Rest HR: 63 {beats}/min
ST Depression (mm): 0 mm

## 2024-02-19 ENCOUNTER — Ambulatory Visit: Payer: Self-pay | Admitting: Internal Medicine

## 2024-02-21 ENCOUNTER — Telehealth: Payer: Self-pay

## 2024-02-21 NOTE — Telephone Encounter (Signed)
 Reviewed. Given these clinical changes, we should postpone his colonoscopy 1 year. Please place a recall in the computer for 1 year from now. If he still on Eliquis , he should be seen in the office, at that time, prior to any scheduling. Thanks, Dr. Abran

## 2024-02-21 NOTE — Telephone Encounter (Signed)
 Called and spoke with patient- patient advised of need for recall colonoscopy;  Patient reports he was put on Eliquis  about a month ago as a preventative for blood clots since he is being monitored for his pacemaker.  Patient also reports that Dr. Suzette thinks he will need a pacemaker battery changed sometime in Aug/Sept 2026.   Please advise if this patient will need to postpone his colonoscopy until pacemaker battery change is done or if the patient just needs an OV with understanding that the Eliquis  will need to be held and cardiac clearance obtained prior to colonoscopy;  Please/thank you Bre, PV RN

## 2024-02-21 NOTE — Telephone Encounter (Signed)
 Please revise recall in EPIC;

## 2024-02-23 NOTE — Telephone Encounter (Signed)
 Recall revised;

## 2024-02-24 NOTE — CV Procedure (Signed)
°  Device system confirmed to be MRI conditional, with implant date > 6 weeks ago, and no evidence of abandoned or epicardial leads in review of most recent CXR  Device last cleared by EP Provider: Daphne Barrack 02/24/2024  Clearance is good through for 1 year as long as parameters remain stable at time of check. If pt undergoes a cardiac device procedure during that time, they should be re-cleared.   Tachy-therapies to be programmed off if applicable with device back to pre-MRI settings after completion of exam.  [employer] will be present for programming for the MRI.   Rocky Catalan, RT  02/24/2024 3:34 PM

## 2024-02-25 LAB — CBC
Hematocrit: 47.5 % (ref 37.5–51.0)
Hemoglobin: 16.1 g/dL (ref 13.0–17.7)
MCH: 31.9 pg (ref 26.6–33.0)
MCHC: 33.9 g/dL (ref 31.5–35.7)
MCV: 94 fL (ref 79–97)
Platelets: 213 x10E3/uL (ref 150–450)
RBC: 5.04 x10E6/uL (ref 4.14–5.80)
RDW: 12.3 % (ref 11.6–15.4)
WBC: 6.6 x10E3/uL (ref 3.4–10.8)

## 2024-02-28 ENCOUNTER — Encounter (HOSPITAL_COMMUNITY): Payer: Self-pay

## 2024-02-29 ENCOUNTER — Other Ambulatory Visit: Payer: Self-pay | Admitting: Internal Medicine

## 2024-02-29 ENCOUNTER — Ambulatory Visit (HOSPITAL_COMMUNITY): Admission: RE | Admit: 2024-02-29 | Discharge: 2024-02-29 | Attending: Internal Medicine

## 2024-02-29 DIAGNOSIS — I422 Other hypertrophic cardiomyopathy: Secondary | ICD-10-CM

## 2024-02-29 DIAGNOSIS — I472 Ventricular tachycardia, unspecified: Secondary | ICD-10-CM | POA: Insufficient documentation

## 2024-02-29 MED ORDER — GADOBUTROL 1 MMOL/ML IV SOLN
10.0000 mL | Freq: Once | INTRAVENOUS | Status: AC | PRN
  Administered 2024-02-29: 10:00:00 10 mL via INTRAVENOUS
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
## 2024-03-24 ENCOUNTER — Telehealth: Payer: Self-pay

## 2024-03-24 ENCOUNTER — Ambulatory Visit: Admitting: Physician Assistant

## 2024-03-24 ENCOUNTER — Encounter: Payer: Self-pay | Admitting: Physician Assistant

## 2024-03-24 ENCOUNTER — Telehealth: Payer: Self-pay | Admitting: Internal Medicine

## 2024-03-24 VITALS — BP 92/58 | HR 67 | Ht 72.0 in | Wt 185.4 lb

## 2024-03-24 DIAGNOSIS — I422 Other hypertrophic cardiomyopathy: Secondary | ICD-10-CM

## 2024-03-24 DIAGNOSIS — K5900 Constipation, unspecified: Secondary | ICD-10-CM | POA: Diagnosis not present

## 2024-03-24 DIAGNOSIS — Z860100 Personal history of colon polyps, unspecified: Secondary | ICD-10-CM | POA: Diagnosis not present

## 2024-03-24 DIAGNOSIS — Z7901 Long term (current) use of anticoagulants: Secondary | ICD-10-CM

## 2024-03-24 MED ORDER — NA SULFATE-K SULFATE-MG SULF 17.5-3.13-1.6 GM/177ML PO SOLN: 1.0000 | Freq: Once | ORAL | 0 refills | Status: AC

## 2024-03-24 NOTE — Telephone Encounter (Signed)
 Patient with diagnosis of high thrombotic risk due to apical aneurysm and HOCM on Eliquis  for anticoagulation.    Procedure: colonoscopy Date of procedure: 04/10/24  Will need to defer to Dr. Santo as this does not fall into our protocol.

## 2024-03-24 NOTE — Telephone Encounter (Signed)
 [facility] Pre-operative Risk Assessment     Request for surgical clearance:     Endoscopy Procedure  What type of surgery is being performed?     colon  When is this surgery scheduled?     04/10/24  What type of clearance is required ?   Pharmacy  Are there any medications that need to be held prior to surgery and how long? Eliquis  2 days  Practice name and name of physician performing surgery?      [facility]  What is your office phone and fax number?      Phone- [phone]  Fax- [phone]  Anesthesia type (None, local, MAC, general) ?       MAC   Please route your response to Corean Amsterdam, [facility]

## 2024-03-24 NOTE — Patient Instructions (Signed)

## 2024-03-24 NOTE — Telephone Encounter (Signed)
Clinical pharmacist to review Eliquis 

## 2024-03-24 NOTE — Progress Notes (Signed)
 Noted

## 2024-03-24 NOTE — Telephone Encounter (Signed)
 Called to review his care prior to colonoscopy:  Nathan [name] is a 64 year old with ApHCM, apical aneurysm and apical mural thrombus who I have called to discuss regarding anticoagulation management prior to a colonoscopy.  He has a history of apical aneurysm and an apical mural thrombus, identified on an MRI conducted on November 16, 2023. The MRI conducted on November 16, 2023, identified an apical mural thrombus. He is currently on Eliquis , a blood thinner, and there is a need to discuss the risks associated with temporarily discontinuing this medication for a colonoscopy. An echocardiogram in September 2025 is part of the ongoing monitoring of his condition.  In 2020, he had three small polyps removed during a colonoscopy, including two tubular adenomas and one sessile serrated polyp, which were precancerous.  Apical aneurysm with left ventricular mural thrombus - Apical mural thrombus identified on MRI in September 2025, appearing layered. Risk of thrombus worsening or embolization if anticoagulation is interrupted; I do not have data to support this. No data on bridging with Lovenox for short-term interruption. We discussed the risks and benefits of AC.  After SDM - proceed with colonoscopy with 48 hrs AC hold. - for longer (5 day) AC holds we may consider lovenox Bridge.  Stanly Leavens, MD FASE [facility] Cardiologist [facility]  [address] [place], [place] [address] [phone]  7:14 PM

## 2024-03-27 NOTE — Telephone Encounter (Signed)
 See separate telephone note from Dr. Marlaine.

## 2024-04-03 ENCOUNTER — Encounter: Payer: Self-pay | Admitting: Internal Medicine

## 2024-04-04 ENCOUNTER — Ambulatory Visit

## 2024-04-04 ENCOUNTER — Other Ambulatory Visit: Payer: Self-pay | Admitting: Medical Genetics

## 2024-04-04 DIAGNOSIS — I422 Other hypertrophic cardiomyopathy: Secondary | ICD-10-CM | POA: Diagnosis not present

## 2024-04-06 ENCOUNTER — Ambulatory Visit: Payer: Self-pay | Admitting: Cardiology

## 2024-04-06 LAB — CUP PACEART REMOTE DEVICE CHECK
Battery Remaining Longevity: 6 mo
Battery Remaining Percentage: 7 %
Battery Voltage: 2.65 V
Brady Statistic AP VP Percent: 1 %
Brady Statistic AP VS Percent: 1 %
Brady Statistic AS VP Percent: 1 %
Brady Statistic AS VS Percent: 99 %
Brady Statistic RA Percent Paced: 1 %
Brady Statistic RV Percent Paced: 1 %
Date Time Interrogation Session: 20260120020018
HighPow Impedance: 51 Ohm
HighPow Impedance: 51 Ohm
Lead Channel Impedance Value: 400 Ohm
Lead Channel Impedance Value: 450 Ohm
Lead Channel Pacing Threshold Amplitude: 0.75 V
Lead Channel Pacing Threshold Amplitude: 1.5 V
Lead Channel Pacing Threshold Pulse Width: 0.5 ms
Lead Channel Pacing Threshold Pulse Width: 0.5 ms
Lead Channel Sensing Intrinsic Amplitude: 12 mV
Lead Channel Sensing Intrinsic Amplitude: 4.2 mV
Lead Channel Setting Pacing Amplitude: 2 V
Lead Channel Setting Pacing Amplitude: 3 V
Lead Channel Setting Pacing Pulse Width: 0.5 ms
Lead Channel Setting Sensing Sensitivity: 0.5 mV
Pulse Gen Serial Number: 7208008

## 2024-04-07 NOTE — Progress Notes (Signed)
 Remote ICD Transmission

## 2024-04-10 ENCOUNTER — Encounter: Admitting: Internal Medicine

## 2024-04-20 ENCOUNTER — Other Ambulatory Visit

## 2024-04-20 DIAGNOSIS — Z006 Encounter for examination for normal comparison and control in clinical research program: Secondary | ICD-10-CM

## 2024-04-20 LAB — GENECONNECT MOLECULAR SCREEN

## 2024-05-05 ENCOUNTER — Ambulatory Visit

## 2024-05-08 ENCOUNTER — Encounter: Admitting: Internal Medicine

## 2024-05-18 ENCOUNTER — Encounter: Payer: Self-pay | Admitting: Medical

## 2024-06-05 ENCOUNTER — Ambulatory Visit

## 2024-07-04 ENCOUNTER — Ambulatory Visit

## 2024-10-03 ENCOUNTER — Ambulatory Visit

## 2024-11-27 ENCOUNTER — Encounter: Payer: Self-pay | Admitting: Medical

## 2025-01-02 ENCOUNTER — Ambulatory Visit

## 2025-04-03 ENCOUNTER — Ambulatory Visit

## 2025-07-03 ENCOUNTER — Ambulatory Visit
